# Patient Record
Sex: Female | Born: 1955 | Race: White | Hispanic: No | Marital: Married | Smoking: Former smoker
Health system: Southern US, Community
[De-identification: ages and names within clinical notes are randomized; demographics above are authoritative.]

## PROBLEM LIST (undated history)

## (undated) DIAGNOSIS — E785 Hyperlipidemia, unspecified: Secondary | ICD-10-CM

## (undated) DIAGNOSIS — F419 Anxiety disorder, unspecified: Secondary | ICD-10-CM

## (undated) HISTORY — DX: Hyperlipidemia, unspecified: E78.5

## (undated) HISTORY — PX: TENDON TRANSFER: SHX2488

## (undated) HISTORY — DX: Anxiety disorder, unspecified: F41.9

## (undated) HISTORY — PX: NEUROMA SURGERY: SHX722

---

## 2002-10-10 ENCOUNTER — Emergency Department: Admit: 2002-10-10 | Payer: Self-pay | Source: Emergency Department

## 2006-05-25 ENCOUNTER — Emergency Department: Admit: 2006-05-25 | Payer: Self-pay | Source: Emergency Department | Admitting: Emergency Medical Services

## 2007-08-19 ENCOUNTER — Ambulatory Visit (HOSPITAL_BASED_OUTPATIENT_CLINIC_OR_DEPARTMENT_OTHER)
Admission: RE | Admit: 2007-08-19 | Disposition: A | Discharge status: Home / Self Care | Payer: Self-pay | Source: Ambulatory Visit | Admitting: Obstetrics & Gynecology

## 2010-08-16 ENCOUNTER — Ambulatory Visit: Admit: 2010-08-16 | Discharge: 2010-08-16 | Payer: Self-pay | Source: Ambulatory Visit

## 2010-10-04 DIAGNOSIS — E785 Hyperlipidemia, unspecified: Secondary | ICD-10-CM | POA: Insufficient documentation

## 2010-11-24 NOTE — Op Note (Signed)
Account Number: [id number]      Document ID: [id number]      Admit Date: 08/19/2007      Procedure Date: 08/19/2007            Patient Location: [id number]      Patient Type: A            SURGEON: Vernie Shanks MD      ASSISTANT:                  PREOPERATIVE DIAGNOSIS:      Persistent postmenopausal bleeding.            POSTOPERATIVE DIAGNOSIS:      Persistent postmenopausal bleeding.            TITLE OF PROCEDURE:      Fractional D and C.            ANESTHESIA:      LMAC.            FINDINGS:      Normal amount of tissue.            ESTIMATED BLOOD LOSS:      Minimal.            FLUIDS:      Lactated Ringers.            SPECIMENS:      ECC and EMC.            DESCRIPTION OF PROCEDURE:      The patient was taken to the operating room with an IV in place, placed on      the operating room table in the supine position.  After adequate anesthesia      was administered, she was then placed in the dorsal lithotomy position.      She was prepped and draped in the sterile fashion.  A speculum was placed      into the vagina.  The cervix was identified.  The anterior lip of the      cervix was grasped with an Allis clamp.  The cervix was dilated with Shawnie Pons      dilators.  The cervix was then curetted with a Novak curet.  The      endometrium was then curetted for a small to moderate amount of tissue.      All curettings were sent to pathology.  The Allis clamp was removed from      the cervix.  The cervix was hemostatic.  All instruments were removed from      the vagina.  The patient was awakened and taken to the recovery room in      stable condition after all lap, needle and instrument counts were correct      x2.                        Electronic Signing Provider      _______________________________     Date/Time Signed: _____________      Vernie Shanks MD [phone])            D:  08/19/2007 12:26 PM by Herma Carson. Sheppard Penton, MD [phone])      T:  08/19/2007 13:53 PM by [id number]          Everlean Cherry: [id number]) (Doc ID: [id number])                  cc:

## 2011-01-25 HISTORY — PX: COLONOSCOPY: SHX174

## 2015-05-10 ENCOUNTER — Other Ambulatory Visit: Payer: Self-pay | Admitting: Internal Medicine

## 2015-08-17 ENCOUNTER — Other Ambulatory Visit: Payer: Self-pay | Admitting: Orthopaedic Surgery

## 2015-08-17 DIAGNOSIS — M79645 Pain in left finger(s): Secondary | ICD-10-CM

## 2015-08-18 ENCOUNTER — Other Ambulatory Visit: Payer: Self-pay | Admitting: Orthopaedic Surgery

## 2015-08-18 ENCOUNTER — Ambulatory Visit: Payer: BC Managed Care – PPO | Attending: Orthopaedic Surgery

## 2015-08-18 DIAGNOSIS — M20092 Other deformity of left finger(s): Secondary | ICD-10-CM | POA: Insufficient documentation

## 2015-08-18 DIAGNOSIS — S63659A Sprain of metacarpophalangeal joint of unspecified finger, initial encounter: Secondary | ICD-10-CM

## 2015-08-18 DIAGNOSIS — S62231A Other displaced fracture of base of first metacarpal bone, right hand, initial encounter for closed fracture: Secondary | ICD-10-CM

## 2015-08-18 DIAGNOSIS — M19042 Primary osteoarthritis, left hand: Secondary | ICD-10-CM | POA: Insufficient documentation

## 2015-08-18 DIAGNOSIS — X58XXXA Exposure to other specified factors, initial encounter: Secondary | ICD-10-CM | POA: Insufficient documentation

## 2015-08-18 DIAGNOSIS — S63642A Sprain of metacarpophalangeal joint of left thumb, initial encounter: Secondary | ICD-10-CM | POA: Insufficient documentation

## 2015-08-18 DIAGNOSIS — R600 Localized edema: Secondary | ICD-10-CM | POA: Insufficient documentation

## 2016-09-12 ENCOUNTER — Other Ambulatory Visit (INDEPENDENT_AMBULATORY_CARE_PROVIDER_SITE_OTHER): Payer: Self-pay

## 2016-09-13 LAB — COMPREHENSIVE METABOLIC PANEL
ALT: 45 U/L — ABNORMAL HIGH (ref 6–29)
AST (SGOT): 31 U/L (ref 10–35)
Albumin/Globulin Ratio: 1.6 (calc) (ref 1.0–2.5)
Albumin: 4.7 g/dL (ref 3.6–5.1)
Alkaline Phosphatase: 64 U/L (ref 33–130)
BUN / Creatinine Ratio: 17 (calc) (ref 6–22)
BUN: 18 mg/dL (ref 7–25)
Bilirubin, Total: 0.8 mg/dL (ref 0.2–1.2)
CO2: 29 mmol/L (ref 20–32)
Calcium: 10.1 mg/dL (ref 8.6–10.4)
Chloride: 101 mmol/L (ref 98–110)
Creatinine: 1.04 mg/dL — ABNORMAL HIGH (ref 0.50–0.99)
EGFR African American: 68 mL/min/{1.73_m2} (ref >=60)
Globulin: 3 g/dL (calc) (ref 1.9–3.7)
Glucose: 92 mg/dL (ref 65–99)
NON-AFRICAN AMERICA EGFR: 58 mL/min/{1.73_m2} — ABNORMAL LOW (ref >=60)
Potassium: 4.4 mmol/L (ref 3.5–5.3)
Protein, Total: 7.7 g/dL (ref 6.1–8.1)
Sodium: 140 mmol/L (ref 135–146)

## 2016-09-13 LAB — LIPID PROFILE WRFL LDL DIRECT
Cholesterol / HDL Ratio: 2.9 (calc) (ref <5.0)
Cholesterol: 223 mg/dL — ABNORMAL HIGH (ref <200)
HDL: 77 mg/dL (ref >50)
LDL Calculated: 124 mg/dL (calc) — ABNORMAL HIGH
NON HDL CHOLESTEROL: 146 mg/dL (calc) — ABNORMAL HIGH (ref <130)
Triglycerides: 112 mg/dL (ref <150)

## 2017-06-19 ENCOUNTER — Ambulatory Visit (INDEPENDENT_AMBULATORY_CARE_PROVIDER_SITE_OTHER): Payer: BC Managed Care – PPO | Admitting: Obstetrics & Gynecology

## 2017-06-19 ENCOUNTER — Encounter (INDEPENDENT_AMBULATORY_CARE_PROVIDER_SITE_OTHER): Payer: Self-pay | Admitting: Obstetrics & Gynecology

## 2017-06-19 DIAGNOSIS — Z01411 Encounter for gynecological examination (general) (routine) with abnormal findings: Secondary | ICD-10-CM

## 2017-06-19 DIAGNOSIS — Z1151 Encounter for screening for human papillomavirus (HPV): Secondary | ICD-10-CM

## 2017-06-19 DIAGNOSIS — R350 Frequency of micturition: Secondary | ICD-10-CM

## 2017-06-19 DIAGNOSIS — Z01419 Encounter for gynecological examination (general) (routine) without abnormal findings: Secondary | ICD-10-CM

## 2017-06-19 MED ORDER — NITROFURANTOIN MONOHYD MACRO 100 MG PO CAPS
100.0000 mg | ORAL_CAPSULE | Freq: Two times a day (BID) | ORAL | 0 refills | Status: DC
Start: 2017-06-19 — End: 2018-09-12

## 2017-06-19 NOTE — Progress Notes (Addendum)
Subjective:       Julie Murphy is a 62 y.o. female here for a routine exam.    Current complaints: urine frequency   Going to [facility] tomorrow with her daughter and grandkids    Gynecologic History  No LMP recorded.  Contraception: none  Breast self exam: discussed     Date of last mammogram 05/10/15  Date of last pap smear 03/09/15  Date of last DEXA 05/11/15  Date of last colonoscopy 8 yrs    The following portions of the patient's history were reviewed and updated as appropriate: allergies, current medications, past family history, past medical history, past social history, past surgical history and problem list.      Review of Systems  Pertinent items are noted in HPI.      Objective:      There were no vitals taken for this visit.  PHYSICAL EXAM:   GENERAL:  well developed and nourished; appropriately groomed; in no apparent distress;   EYES:  EOMI; PERRLA; normal lids, conjunctiva,   E/N/T:  normal EACs, TMs, nasal/oral mucosa, teeth, gingiva,  NECK:  supple, full ROM; no thyromegaly;  RESPIRATORY: symmetric expansion; no dyspnea;    GASTROINTESTINAL: nontender, nondistended; no hepatosplenomegaly or masses; rectal exam without masses   GENITOURINARY: external genitalia: normal hair distribution; ; vagina: normal mucosa; ; cervix: no lesions no cervical motion tenderness noted;; Her uterus: is antiverted, small, and nontender; ; adnexa: without masses; ;   LYMPHATICS:  no adenopathy in cervical, supraclavicular, axillary, or inguinal regions;   BREAST/INTEGUMENT: BREASTS: breast exam is normal without masses, skin changes, or nipple discharge;   MUSCULOSKELETAL:  Normal range of motion, strength and tone;   PSYCHIATRIC:  appropriate affect and demeanor; normal speech pattern; grossly normal memory;           Assessment:      Healthy female exam.        Plan:       1. Thin prep pap/HPV Yes   2. Referral for mammogram given  3. To [facility] in one year for annual, or prn for problems  4. Urine culture sent  5. rx  for macrobid sent in    Vernie Shanks, MD   I personally saw and examined this patient on day of exam.  In error this note is signed by MA A. Yevette Edwards, MD

## 2017-06-21 LAB — URINE CULTURE

## 2017-06-25 LAB — THINPREP IMAGING PAP & HPV MRNA E6/E7.
HPV mRNA E6/E7: NOT DETECTED
Pap Interpretation/Result: UNDETERMINED

## 2017-07-02 ENCOUNTER — Other Ambulatory Visit: Payer: Self-pay | Admitting: Obstetrics & Gynecology

## 2017-08-12 ENCOUNTER — Other Ambulatory Visit (INDEPENDENT_AMBULATORY_CARE_PROVIDER_SITE_OTHER): Payer: Self-pay

## 2017-08-13 LAB — COMPREHENSIVE METABOLIC PANEL
ALT: 19 U/L (ref 6–29)
AST (SGOT): 25 U/L (ref 10–35)
Albumin/Globulin Ratio: 1.7 (calc) (ref 1.0–2.5)
Albumin: 4.6 g/dL (ref 3.6–5.1)
Alkaline Phosphatase: 52 U/L (ref 33–130)
BUN: 15 mg/dL (ref 7–25)
Bilirubin, Total: 0.8 mg/dL (ref 0.2–1.2)
CO2: 30 mmol/L (ref 20–32)
Calcium: 9.7 mg/dL (ref 8.6–10.4)
Chloride: 102 mmol/L (ref 98–110)
Creatinine: 0.95 mg/dL (ref 0.50–0.99)
EGFR African American: 75 mL/min/{1.73_m2} (ref >=60)
Globulin: 2.7 g/dL (calc) (ref 1.9–3.7)
Glucose: 103 mg/dL — ABNORMAL HIGH (ref 65–99)
NON-AFRICAN AMERICA EGFR: 65 mL/min/{1.73_m2} (ref >=60)
Potassium: 3.9 mmol/L (ref 3.5–5.3)
Protein, Total: 7.3 g/dL (ref 6.1–8.1)
Sodium: 140 mmol/L (ref 135–146)

## 2017-08-13 LAB — CBC AND DIFFERENTIAL
Baso(Absolute): 49 cells/uL (ref 0–200)
Basophils: 0.7 %
Eosinophils Absolute: 91 cells/uL (ref 15–500)
Eosinophils: 1.3 %
Hematocrit: 42 % (ref 35.0–45.0)
Hemoglobin: 13.8 g/dL (ref 11.7–15.5)
Lymphocytes Absolute: 2604 cells/uL (ref 850–3900)
Lymphocytes: 37.2 %
MCH: 26.6 pg — ABNORMAL LOW (ref 27.0–33.0)
MCHC: 32.9 g/dL (ref 32.0–36.0)
MCV: 81.1 fL (ref 80.0–100.0)
MPV: 10.2 fL (ref 7.5–12.5)
Monocytes Absolute: 560 cells/uL (ref 200–950)
Monocytes: 8 %
Neutrophils Absolute: 3696 cells/uL (ref 1500–7800)
Neutrophils: 52.8 %
Platelets: 210 10*3/uL (ref 140–400)
RBC: 5.18 10*6/uL — ABNORMAL HIGH (ref 3.80–5.10)
RDW: 13.6 % (ref 11.0–15.0)
WBC: 7 10*3/uL (ref 3.8–10.8)

## 2017-08-13 LAB — LIPID PANEL
Cholesterol / HDL Ratio: 3 (calc) (ref <5.0)
Cholesterol: 201 mg/dL — ABNORMAL HIGH (ref <200)
HDL: 67 mg/dL (ref >50)
LDL Calculated: 113 mg/dL (calc) — ABNORMAL HIGH
NON HDL CHOLESTEROL: 134 mg/dL (calc) — ABNORMAL HIGH (ref <130)
Triglycerides: 99 mg/dL (ref <150)

## 2017-08-13 LAB — TSH: TSH: 2.71 mIU/L (ref 0.40–4.50)

## 2017-08-13 LAB — VITAMIN D,25 OH,TOTAL: Vitamin D, 25 OH, Total: 51 ng/mL (ref 30–100)

## 2017-08-26 ENCOUNTER — Other Ambulatory Visit (INDEPENDENT_AMBULATORY_CARE_PROVIDER_SITE_OTHER): Payer: Self-pay | Admitting: Obstetrics & Gynecology

## 2017-08-26 DIAGNOSIS — Z01419 Encounter for gynecological examination (general) (routine) without abnormal findings: Secondary | ICD-10-CM

## 2017-10-12 ENCOUNTER — Other Ambulatory Visit: Payer: Self-pay | Admitting: Orthopaedic Surgery

## 2017-10-28 ENCOUNTER — Encounter (INDEPENDENT_AMBULATORY_CARE_PROVIDER_SITE_OTHER): Payer: Self-pay

## 2017-10-28 ENCOUNTER — Ambulatory Visit (INDEPENDENT_AMBULATORY_CARE_PROVIDER_SITE_OTHER): Payer: BC Managed Care – PPO | Admitting: Neurological Surgery

## 2017-10-28 VITALS — BP 108/71 | HR 72 | Ht 62.0 in | Wt 147.0 lb

## 2017-10-28 DIAGNOSIS — G8929 Other chronic pain: Secondary | ICD-10-CM

## 2017-10-28 DIAGNOSIS — M545 Low back pain: Secondary | ICD-10-CM

## 2017-10-28 NOTE — Progress Notes (Signed)
A comprehensive review of systems was performed and revealed the following:    Constitutional: negative for - fever, chills, sweating, fatigue, change in activity  General: headaches -negative for - recent weight gain, recent weight loss, appetite change  Eyes: negative for - blurry vision, double vision, eye pain, light sensitivity, eye discharge  ENT: negative for - hearing loss, nasal discharge, hoarseness, sore throat, swallowing difficulty  Heart: negative for - chest pain or tightness, palpitations, fainting, other heart trouble  Lungs: negative for - chronic cough, shortness of breath, wheezing, sleep apnea, hemoptysis  Gastrointestinal: negative for - abdominal pain, nausea, vomiting, diarrhea, constipation, bloody stool  Genito-Urinary: negative for - urinary retention, urinary incontinence, urinary urgency, urinary discharge  Musculoskeletal: negative for - joint swelling, joint pain, leg swelling, leg cramping, fractures  Neurological: negative for - dizziness, tremors, memory loss, speech difficulty, seizures  Hematological and Lymphatic: negative for - easy bleeding, easy bruising, swollen nodes  Skin: negative for - rash, enlarged glands, moles or skin lesions, color change, infection  Psychaitric: negative for - anxiety, depression, confusion, excessive stress, suicidal thoughts    All other systems were reviewed by me and are negative.

## 2017-10-29 ENCOUNTER — Other Ambulatory Visit: Payer: Self-pay

## 2017-10-29 ENCOUNTER — Encounter (INDEPENDENT_AMBULATORY_CARE_PROVIDER_SITE_OTHER): Payer: Self-pay | Admitting: Neurological Surgery

## 2017-10-29 ENCOUNTER — Ambulatory Visit: Admission: RE | Admit: 2017-10-29 | Payer: Self-pay | Source: Ambulatory Visit

## 2017-10-29 NOTE — Progress Notes (Signed)
NEUROSURGERY ATTENDING - CONSULTATION NOTE      Date: 10/29/2017  Patient Name: Julie Murphy, Julie Murphy    Please CC and send a report to:  Patient Care Team:  Lars Masson, MD as PCP - General (Family Medicine)    Diagnosis / Chief Complaint:     Low back pain and left buttock/thigh pain    History of Present Illness:     I was asked to see Julie Murphy in consultation in order to render my professional opinion regarding the aforementioned chief complaint.    Julie Murphy is a 62 y.o. right-handed female who presents with low back pain and left buttock to mid thigh pain for the past 15 years which has worsened over the past 3 months.  Patient reports that she has a history of scoliosis which she has known since she was 31.  She has had mild aching back pain for many years but over the past 3 months has noticed that the pain has radiated into the left buttock and into the posterior left thigh midway.  Aggravating factors include heavy lifting and prolonged periods of standing.  Ice and Aleve helped to alleviate.  She denies a specific inciting event.    She has tried medications including anti-inflammatory medications, and oral steroids which have helped calm down her symptoms.  Bed rest and Pilates helps to improve her symptoms.  She denies any previous history of spine surgery.    She rates the pain in her low back a 5/10 usually but today is a 2/10.  She denies any right leg pain.  She rates the pain in her left leg a 5-9/10 usually but today is a 2/10. 10% of her pain is located in the low back versus 90% in the left buttock and left thigh.  Her pain worsens with lifting and bending.  Pain improves with medication.  She denies any weakness.  She reports burning in the same distribution which started approximately 2-3 months ago.  She denies any hand clumsiness.  She denies any difficulty with balance.  She reports difficulty walking longer than 2 miles due to low back pain.  She denies any bowel or bladder  dysfunction.  She denies any difficulty with sexual functioning.  She denies that her sleep is affected by her condition. She states that her symptoms were significantly improved after taking a steroid taper.     History was obtained from chart review and the patient.      Review of Systems:     A comprehensive review of systems was performed and revealed the following:    Constitutional: negative for - fever, chills, sweating, fatigue, change in activity  General: headaches -negative for - recent weight gain, recent weight loss, appetite change   Eyes: negative for - blurry vision, double vision, eye pain, light sensitivity, eye discharge  ENT: negative for - hearing loss, nasal discharge, hoarseness, sore throat, swallowing difficulty  Heart: negative for - chest pain or tightness, palpitations, fainting, other heart trouble  Lungs: negative for - chronic cough, shortness of breath, wheezing, sleep apnea, hemoptysis  Gastrointestinal: negative for - abdominal pain, nausea, vomiting, diarrhea, constipation, bloody stool  Genito-Urinary: negative for - urinary retention, urinary incontinence, urinary urgency, urinary discharge  Musculoskeletal: negative for - joint swelling, joint pain, leg swelling, leg cramping, fractures  Neurological: negative for - dizziness, tremors, memory loss, speech difficulty, seizures  Hematological and Lymphatic: negative for - easy bleeding, easy bruising, swollen nodes  Skin: negative  for - rash, enlarged glands, moles or skin lesions, color change, infection  Psychaitric: negative for - anxiety, depression, confusion, excessive stress, suicidal thoughts    All other systems were reviewed by me and are negative.    Color Code  Yellow - Numbness sensation  Purple - Pins & Needles sensation  Red     - Burning sensation  Blue    - Aching pain  Green Lambert Mody and Stabbing pain  Manson Passey - Other    Past Medical History:     I reviewed the past medical history myself. The patient has a history  of anxiety.     Past Surgical History:     Past Surgical History:   Procedure Laterality Date   . CESAREAN SECTION  12/23/1980   . CESAREAN SECTION  04/18/1983   . NEUROMA SURGERY      lf foot   . TENDON TRANSFER      left thumb x 2     Family History:     Family History   Problem Relation Age of Onset   . Abdominal Aortic Aneurysm Father      Social History:     Social History     Socioeconomic History   . Marital status: Married     Spouse name: Maisie Fus   . Number of children: 2   . Years of education: Not on file   . Highest education level: Not on file   Occupational History   . Not on file   Social Needs   . Financial resource strain: Not on file   . Food insecurity:     Worry: Not on file     Inability: Not on file   . Transportation needs:     Medical: Not on file     Non-medical: Not on file   Tobacco Use   . Smoking status: Former Games developer   . Smokeless tobacco: Never Used   . Tobacco comment: quit 30 yrs ago   Substance and Sexual Activity   . Alcohol use: Yes     Comment: occasionally   . Drug use: No   . Sexual activity: Not on file   Lifestyle   . Physical activity:     Days per week: Not on file     Minutes per session: Not on file   . Stress: Not on file   Relationships   . Social connections:     Talks on phone: Not on file     Gets together: Not on file     Attends religious service: Not on file     Active member of club or organization: Not on file     Attends meetings of clubs or organizations: Not on file     Relationship status: Not on file   . Intimate partner violence:     Fear of current or ex partner: Not on file     Emotionally abused: Not on file     Physically abused: Not on file     Forced sexual activity: Not on file   Other Topics Concern   . Not on file   Social History Narrative   . Not on file     Julie Murphy is married. She has 2 children. She lives with her husband. She works as a Veterinary surgeon. She drinks less than 1 glass of wine a week. She quit smoking 16 years ago. She smoked 1ppd  for 15 years. She denies current tobacco use and  she denies recreational drug use.     Allergies:     No Known Allergies    Medications:     Current Outpatient Medications on File Prior to Visit   Medication Sig Dispense Refill   . citalopram (CELEXA) 40 MG tablet Take 40 mg by mouth daily     . nitrofurantoin, macrocrystal-monohydrate, (MACROBID) 100 MG capsule Take 1 capsule (100 mg total) by mouth 2 (two) times daily 10 capsule 0     No current facility-administered medications on file prior to visit.      Vital Signs:     Vitals:    10/28/17 1551   BP: 108/71   Pulse: 72       Physical Exam:     General: No acute distress, cooperative with examination  Psychologic: Affect appropriate, judgment and insight consistent with situation, no delusions or hallucinations  Skin: Warm, dry, no obvious lesions or scars  Eyes: Sclerae anicteric, no conjunctival injection  ENT: No visible otorrhea, no rhinorrhea, trachea midline  Head: Normocephalic  Neck: No palpable masses  Musculoskeletal: Full ROM, normal muscle tone, no atrophy  Pulmonary: Normal respiratory effort, no audible wheezing  Cardiovascular: No pedal edema, pulses 2+ in bilat lower extremities  Abdominal: Non-tender to palpation, non-distended, no organomegaly, no palpable masses    Neuro exam:   Awake, alert, orientedx3  Speech clear and fluent  Attention span normal  PERRL, EOMI  Facial sensation intact  Face symmetric  Hearing intact  Tongue midline  Shoulder shrug strong bilaterally  Motor:   Arms:     Deltoid  Bicep Tricep Grip IO   Right 5 5 5 5 5    Left  5 5 5 5 5       Legs:     HF KE KF DF PF EHL   Right 5 5 5 5 5 5    Left  5 5 5 5 5 5      No pronator drift  No dysmetria  Light touch and pinprick intact in all 4 extremities   DTRs:     Biceps Triceps Brachiorad Patellar Ankle   Right 2+ 2+ 2+ 2+ 2+   Left  2+ 2+ 2+ 2+ 2+     Positive Hoffmann's sign bilaterally  No Clonus bilat  Gait normal  able to tandem gait  able to toe and heel walk    Right  shoulder hump visible when patient is bent over touching toes.     Labs:     No results found for: WBC, HGB, HCT, MCV, PLT  No results found for: NA, K, CL, CO2  No results found for: INR, PT  No results found for: BUN  No results found for: CREAT    Imaging:     I reviewed the patient's imaging myself. My own interpretation of the MRI of the lumbar spine without contrast is that shows multilevel spondylosis throughout the lumbar spine.  There is a grade 1 spondylolisthesis at L4-L5, with bilateral facet arthropathy at that level.  There is associated severe central stenosis at that level, as well as bilateral foraminal stenosis.  There is mild levoscoliosis with apex at L2.      Assessment:     62 y.o. female presenting with severe low back pain and left-sided buttock pain.  She is nonfocal examination.  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      Plan:     I had an  extensive discussion with the patient regarding the condition. I showed her the imaging, and went over its findings in detail. I explained to the patient that her symptoms are most likely caused by nerve compression at L4-L5 level.  I explained to her that the ultimate treatment is a surgical decompression and posterior spinal fusion.  I told her that at this time my recommendation is to get an epidural steroid injection on the left side at L4-L5, both with and for diagnostic purposes.  I referred to Dr. Lattie Corns from [facility] for the purpose.  I also recommended obtaining x-rays of the lumbar spine and flexion-extension views in order to assess the subluxation at L4-L5.  I will see her back in 2 months to review the results of the injection and x-ray, and to discuss surgical options.  All questions were answered.    Thank you for the opportunity of allowing me to participate in the care of Julie Murphy.      Hoyle Sauer. Mitsuo Budnick, MD  Minimally Invasive and Complex Spine Surgery &  General Neurosurgery  Department of Neurosciences   Medical Group Neurosurgery  Assistant Professor of Neurosurgery  [facility] Address: [address]  [place], [place] [address]  Office Phone [phone]  Appointments [phone]  Fax [phone]

## 2017-11-04 ENCOUNTER — Other Ambulatory Visit: Payer: Self-pay | Admitting: Physician Assistant

## 2017-11-04 ENCOUNTER — Other Ambulatory Visit: Payer: Self-pay | Admitting: Family Medicine

## 2017-11-04 ENCOUNTER — Ambulatory Visit (INDEPENDENT_AMBULATORY_CARE_PROVIDER_SITE_OTHER): Payer: Self-pay | Admitting: Neurological Surgery

## 2017-11-06 ENCOUNTER — Encounter (INDEPENDENT_AMBULATORY_CARE_PROVIDER_SITE_OTHER): Payer: Self-pay | Admitting: Physician Assistant

## 2017-11-13 ENCOUNTER — Encounter (INDEPENDENT_AMBULATORY_CARE_PROVIDER_SITE_OTHER): Payer: Self-pay

## 2017-12-06 HISTORY — PX: COLONOSCOPY: SHX174

## 2017-12-23 ENCOUNTER — Ambulatory Visit (INDEPENDENT_AMBULATORY_CARE_PROVIDER_SITE_OTHER): Payer: BC Managed Care – PPO | Admitting: Neurological Surgery

## 2018-09-01 ENCOUNTER — Telehealth (INDEPENDENT_AMBULATORY_CARE_PROVIDER_SITE_OTHER): Payer: Self-pay | Admitting: Physician Assistant

## 2018-09-01 ENCOUNTER — Encounter (INDEPENDENT_AMBULATORY_CARE_PROVIDER_SITE_OTHER): Payer: Self-pay | Admitting: Physician Assistant

## 2018-09-01 ENCOUNTER — Other Ambulatory Visit (INDEPENDENT_AMBULATORY_CARE_PROVIDER_SITE_OTHER): Payer: Self-pay | Admitting: Physician Assistant

## 2018-09-01 DIAGNOSIS — M48061 Spinal stenosis, lumbar region without neurogenic claudication: Secondary | ICD-10-CM

## 2018-09-01 NOTE — Telephone Encounter (Signed)
Informed patient to get MRI first then f/u with Dr. Irving Burton. Emailed MRI order to patient

## 2018-09-09 ENCOUNTER — Ambulatory Visit: Payer: 59

## 2018-09-11 NOTE — Progress Notes (Signed)
NEUROSURGERY ATTENDING - FOLLOW-UP NOTE      Date: 09/11/2018  Patient Name: Julie Murphy    Please CC and send a report to:  Patient Care Team:  Lars Masson, MD as PCP - General (Family Medicine)  Edgardo Roys, MD    Diagnosis / Chief Complaint:     Low back pain and left buttock/thigh pain    History of Present Illness:     I had the pleasure of seeing Julie Murphy in follow-up today regarding the aforementioned chief complaint.     Julie Murphy is a 63 y.o. right-handed female who initially presented on 10/28/2017 with a 3 month history of worsening low back pain radiating into the left buttock and mid thigh. Patient complained of a 15 year history of symptoms. Aggravating factors include heavy lifting and prolonged periods of standing. Ice and Aleve helped to alleviate. She has tried medications, bedrest and Pilates. 10% of her pain is located in the low back versus 90% in the left buttock and left thigh. 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 She returns today with worsening symptoms.     Today, the patient denies any pain.  She states that 2 weeks ago she was at a Pilates class when she went to get up and experienced acute left low back pain.  The pain subsided after 1 day.  However, 3 days after the initial incident she noticed left thigh numbness.  The numbness is located in the proximal and anterior aspect of the left thigh.  She denies any specific focal motor weakness, bowel or bladder dysfunction.  She states that she no longer has back pain.  She states that before this incident, she experienced significant improvement in her symptoms.  She does Pilates 3-4 times a week with good results.    History was obtained from chart review and the patient.    Review of Systems:     See HPI.           Color Code  Yellow - Numbness sensation  Purple - Pins & Needles sensation  Red     - Burning sensation  Blue    - Aching pain  Green Lambert Mody and Stabbing pain  Manson Passey - Other    Past Medical History:     No past medical history on file.    Past Surgical History:     Past Surgical History:   Procedure Laterality Date    CESAREAN SECTION  12/23/1980    CESAREAN SECTION  04/18/1983    NEUROMA SURGERY      lf foot    TENDON TRANSFER      left thumb x 2       Family History:     Family History   Problem Relation Age of Onset    Abdominal Aortic Aneurysm Father        Social History:     Social History     Socioeconomic History    Marital status: Married     Spouse name: Maisie Fus    Number of children: 2    Years of education: Not on file    Highest education level: Not on file   Occupational History    Not on file   Social Needs    Financial resource strain: Not on file    Food insecurity     Worry: Not on file  Inability: Not on file    Transportation needs     Medical: Not on file     Non-medical: Not on file   Tobacco Use    Smoking status: Former Smoker    Smokeless tobacco: Never Used    Tobacco comment: quit 30 yrs ago   Substance and Sexual Activity    Alcohol use: Yes     Comment: occasionally    Drug use: No    Sexual activity: Not on file   Lifestyle    Physical activity     Days per week: Not on file     Minutes per session: Not on file    Stress: Not on file   Relationships    Social connections     Talks on phone: Not on file     Gets together: Not on file     Attends religious service: Not on file     Active member of club or organization: Not on file     Attends meetings of clubs or organizations: Not on file     Relationship status: Not on file    Intimate partner violence     Fear of current or ex partner: Not on file     Emotionally abused: Not on file     Physically abused: Not on file     Forced sexual activity: Not on file   Other Topics Concern    Not on file   Social History Narrative    Not on file       Allergies:     No Known Allergies    Medications:     Current Outpatient Medications on File Prior to  Visit   Medication Sig Dispense Refill    citalopram (CELEXA) 40 MG tablet Take 40 mg by mouth daily      nitrofurantoin, macrocrystal-monohydrate, (MACROBID) 100 MG capsule Take 1 capsule (100 mg total) by mouth 2 (two) times daily 10 capsule 0     No current facility-administered medications on file prior to visit.        Vital Signs:     There were no vitals filed for this visit.    Physical Exam:     General: No acute distress, cooperative with examination  Psychologic: Affect appropriate, judgment and insight consistent with situation, no delusions or hallucinations  Skin: Warm, dry, no obvious lesions or scars  Eyes: Sclerae anicteric, no conjunctival injection  ENT: No visible otorrhea, no rhinorrhea, trachea midline  Head: Normocephalic  Neck: No palpable masses  Musculoskeletal: Full ROM, normal muscle tone, no atrophy  Pulmonary: Normal respiratory effort, no audible wheezing  Cardiovascular: No pedal edema, pulses 2+ in bilat lower extremities  Abdominal: Non-tender to palpation, non-distended, no organomegaly, no palpable masses    Neuro exam:   Awake, alert, orientedx3  Speech clear and fluent  Attention span normal  PERRL, EOMI  Facial sensation intact  Face symmetric  Hearing intact  Tongue midline  Shoulder shrug strong bilaterally  Motor:   Arms:      Deltoid  Bicep Tricep Grip IO   Right 5 5 5 5 5    Left  5 5 5 5 5        Legs:      HF KE KF DF PF EHL   Right 5 5 5 5 5 5    Left  5 5 5 5 5 5      No pronator drift  No dysmetria  Light touch and pinprick intact in  all 4 extremities except decreased to light touch in the left proximal thigh anteriorly extending from the left lateral hip down to mid left anterior thigh.  DTRs:      Biceps Triceps Brachiorad Patellar Ankle   Right 2+ 2+ 2+ 2+ 2+   Left  2+ 2+ 2+ 2+ 2+     No Hoffmann's sign bilaterally  No Clonus bilaterally  Gait normal  able to tandem gait  able to toe and heel walk    No tenderness to palpation in the bilateral SI joints.  Negative  straight leg raise bilaterally.    Labs:     No results found for: WBC, HGB, HCT, MCV, PLT  No results found for: NA, K, CL, CO2  No results found for: INR, PT  No results found for: BUN  No results found for: CREAT    Imaging:     No new imaging      Assessment:     63 y.o. female initially seen by me approximately 1 year ago for severe leg pain.  Since then the pain had resolved, but has recently reemerged after the patient underwent Pilates training.  In addition, the patient had acute onset left thigh numbness.  On examination, the patient has decreased sensation to light touch in the left anterior thigh.      Plan:     I had an extensive discussion with the patient regarding the condition. I showed her the old imaging, and went over its findings in detail. 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  However, there is no evidence of dynamic instability on flexion-extension, and the patient does not have symptoms of neurogenic claudications.  For this reason, I did not recommend any surgical intervention to fix the problem.  I explained to the patient that her new onset severe low back pain may be musculoskeletal in nature.  For this reason, I recommended physical therapy and conservative treatment.  Regarding her acute onset left thigh numbness, I recommended EMG/NCS of the lower extremities in order to better delineate the etiology of the symptoms.  I also recommended MRI of lumbar spine to rule out any new disc herniation causing nerve root compression, which could be causing her symptoms of numbness.  I will see her again in 1 month to evaluate her condition and go over the results of the studies.  All questions were answered.    I spent over 30 minutes with the patient today and more than 50% of this time was spent in counseling and coordinating the patient's care.    Thank you for the opportunity of allowing me to  participate in the care of Mrs. Julie Murphy.    Hoyle Sauer. Lerone Onder, MD  Minimally Invasive and Complex Spine Surgery & General Neurosurgery  Department of Neurosciences   [facility]  Assistant Professor of Neurosurgery   [facility] Address: [address]   [place], [place] [address]  Office Phone [phone]   Fax [phone]

## 2018-09-12 ENCOUNTER — Ambulatory Visit (INDEPENDENT_AMBULATORY_CARE_PROVIDER_SITE_OTHER): Payer: 59 | Admitting: Neurological Surgery

## 2018-09-12 ENCOUNTER — Encounter (INDEPENDENT_AMBULATORY_CARE_PROVIDER_SITE_OTHER): Payer: Self-pay

## 2018-09-12 ENCOUNTER — Encounter (INDEPENDENT_AMBULATORY_CARE_PROVIDER_SITE_OTHER): Payer: Self-pay | Admitting: Neurological Surgery

## 2018-09-12 VITALS — BP 109/71 | HR 58 | Ht 62.0 in | Wt 148.0 lb

## 2018-09-12 DIAGNOSIS — M48061 Spinal stenosis, lumbar region without neurogenic claudication: Secondary | ICD-10-CM

## 2018-09-15 ENCOUNTER — Encounter (INDEPENDENT_AMBULATORY_CARE_PROVIDER_SITE_OTHER): Payer: Self-pay

## 2018-09-16 ENCOUNTER — Encounter (INDEPENDENT_AMBULATORY_CARE_PROVIDER_SITE_OTHER): Payer: Self-pay | Admitting: Neurological Surgery

## 2018-09-22 ENCOUNTER — Encounter (INDEPENDENT_AMBULATORY_CARE_PROVIDER_SITE_OTHER): Payer: Self-pay | Admitting: Neurological Surgery

## 2018-09-23 ENCOUNTER — Other Ambulatory Visit: Payer: 59

## 2018-09-24 ENCOUNTER — Encounter (INDEPENDENT_AMBULATORY_CARE_PROVIDER_SITE_OTHER): Payer: Self-pay

## 2018-09-24 NOTE — Progress Notes (Signed)
MRI Lumbar spine without contrast   Auth #: [id number]  Dates valid: 09/24/2018-10/23/2018  Location: [facility]

## 2018-09-26 ENCOUNTER — Encounter (INDEPENDENT_AMBULATORY_CARE_PROVIDER_SITE_OTHER): Payer: Self-pay | Admitting: Neurological Surgery

## 2018-09-30 ENCOUNTER — Encounter (INDEPENDENT_AMBULATORY_CARE_PROVIDER_SITE_OTHER): Payer: Self-pay | Admitting: Neurological Surgery

## 2018-10-03 ENCOUNTER — Encounter (INDEPENDENT_AMBULATORY_CARE_PROVIDER_SITE_OTHER): Payer: Self-pay

## 2018-10-03 ENCOUNTER — Ambulatory Visit (INDEPENDENT_AMBULATORY_CARE_PROVIDER_SITE_OTHER): Payer: 59 | Admitting: Neurological Surgery

## 2018-10-03 ENCOUNTER — Encounter (INDEPENDENT_AMBULATORY_CARE_PROVIDER_SITE_OTHER): Payer: Self-pay | Admitting: Neurological Surgery

## 2018-10-03 VITALS — BP 109/69 | HR 66 | Ht 62.0 in | Wt 148.0 lb

## 2018-10-03 DIAGNOSIS — G5712 Meralgia paresthetica, left lower limb: Secondary | ICD-10-CM

## 2018-10-03 DIAGNOSIS — M48061 Spinal stenosis, lumbar region without neurogenic claudication: Secondary | ICD-10-CM

## 2018-10-03 NOTE — Progress Notes (Signed)
NEUROSURGERY ATTENDING - FOLLOW-UP NOTE      Date: 10/03/2018  Patient Name: Julie Murphy, Julie Murphy    Please CC and send a report to:  Patient Care Team:  Lars Masson, MD as PCP - General (Family Medicine)  Edgardo Roys, MD    Diagnosis / Chief Complaint:     Low back pain and left buttock/thigh pain    History of Present Illness:     I had the pleasure of seeing Julie Murphy in follow-up today regarding the aforementioned chief complaint.     Julie Murphy is a 63 y.o. right-handed female who initially presented on 10/28/2017 with a 3 month history of worsening low back pain radiating into the left buttock and mid thigh. Patient complained of a 15 year history of symptoms. Aggravating factors include heavy lifting and prolonged periods of standing. Ice and Aleve helped to alleviate. She has tried medications, bedrest and Pilates. 10% of her pain is located in the low back versus 90% in the left buttock and left thigh. 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 Her last follow up was on 09/12/2018 where she returned with worsening pain. She returns today with EMG/NCS.    Today, the patient rates her pain a 0/10. She reports no interval changes in her symptoms since her last visit. She continues to have left proximal thigh numbness. She denies back pain today but will have tightness in the low back intermittently. Stretching typically helps the tightness. She denies any bowel/bladder dysfunction, saddle anesthesia or difficulty walking long distances. She denies any specific focal motor weakness.     History was obtained from chart review and the patient.    Review of Systems:     See HPI.           Color Code  Yellow - Numbness sensation  Purple - Pins & Needles sensation  Red     - Burning sensation  Blue    - Aching pain  Green Lambert Mody and Stabbing pain  Manson Passey - Other    Past Medical History:     History reviewed. No pertinent past medical history.     Past Surgical History:     Past Surgical History:   Procedure Laterality Date    CESAREAN SECTION  12/23/1980    CESAREAN SECTION  04/18/1983    NEUROMA SURGERY      lf foot    TENDON TRANSFER      left thumb x 2       Family History:     Family History   Problem Relation Age of Onset    Abdominal Aortic Aneurysm Father        Social History:     Social History     Socioeconomic History    Marital status: Married     Spouse name: Maisie Fus    Number of children: 2    Years of education: None    Highest education level: None   Occupational History    None   Social Engineer, site strain: None    Food insecurity     Worry: None     Inability: None    Transportation needs     Medical: None     Non-medical: None   Tobacco Use    Smoking status: Former Smoker    Smokeless tobacco: Never Used    Tobacco comment: quit 30 yrs  ago   Substance and Sexual Activity    Alcohol use: Yes     Comment: occasionally    Drug use: No    Sexual activity: None   Lifestyle    Physical activity     Days per week: None     Minutes per session: None    Stress: None   Relationships    Social connections     Talks on phone: None     Gets together: None     Attends religious service: None     Active member of club or organization: None     Attends meetings of clubs or organizations: None     Relationship status: None    Intimate partner violence     Fear of current or ex partner: None     Emotionally abused: None     Physically abused: None     Forced sexual activity: None   Other Topics Concern    None   Social History Narrative    None       Allergies:     No Known Allergies    Medications:     Current Outpatient Medications on File Prior to Visit   Medication Sig Dispense Refill    atorvastatin (LIPITOR) 10 MG tablet Take 10 mg by mouth daily      citalopram (CELEXA) 40 MG tablet Take 40 mg by mouth daily      vitamin D (CHOLECALCIFEROL) 25 MCG (1000 UT) tablet Take 1,000 Units by mouth daily       No  current facility-administered medications on file prior to visit.        Vital Signs:     Vitals:    10/03/18 1153   BP: 109/69   Pulse: 66       Physical Exam:     General: No acute distress, cooperative with examination  Psychologic: Affect appropriate, judgment and insight consistent with situation, no delusions or hallucinations  Skin: Warm, dry, no obvious lesions or scars  Eyes: Sclerae anicteric, no conjunctival injection  ENT: No visible otorrhea, no rhinorrhea, trachea midline  Head: Normocephalic  Neck: No palpable masses  Musculoskeletal: Full ROM, normal muscle tone, no atrophy  Pulmonary: Normal respiratory effort, no audible wheezing  Cardiovascular: No pedal edema, pulses 2+ in bilat lower extremities  Abdominal: Non-tender to palpation, non-distended, no organomegaly, no palpable masses    Neuro exam:   Awake, alert, orientedx3  Speech clear and fluent  Attention span normal  PERRL, EOMI  Facial sensation intact  Face symmetric  Hearing intact  Tongue midline  Shoulder shrug strong bilaterally  Motor:              Arms:     Deltoid  Bicep Tricep Grip IO   Right 5 5 5 5 5    Left  5 5 5 5 5                  Legs:     HF KE KF DF PF EHL   Right 5 5 5 5 5 5    Left  5 5 5 5 5 5      No pronator drift  No dysmetria  Light touch and pinprick intact in all 4 extremities except decreased to light touch in the left proximal thigh anteriorly extending from the left lateral hip down to mid left anterior thigh.  DTRs:     Biceps Triceps Brachiorad Patellar Ankle   Right 2+ 2+ 2+ 2+  2+   Left  2+ 2+ 2+ 2+ 2+     No Hoffmann's sign bilaterally  No Clonus bilaterally  Gait normal  able to tandem gait  able to toe and heel walk    No tenderness to palpation in the bilateral SI joints.  Negative straight leg raise bilaterally.    Labs:     No results found for: WBC, HGB, HCT, MCV, PLT  No results found for: NA, K, CL, CO2  No results found for: INR, PT  No results found for: BUN  No results found for: CREAT     Imaging:     EMG/NCS 09/26/2018:  Conclusions:  This is a normal electrodiagnostic examination.  There is no evidence of a left lumbosacral radiculopathy or other left lower limb peripheral nerve injury.  Left lateral femoral cutaneous sensory nerve conduction was unobtainable both the left (symptomatic side) and the right (asymptomatic side).  This is not unusual in a patient of this age.  Because of this it is not possible to exclude the possibility of a lateral femoral cutaneous neuropathy contributing to the symptoms.    My own interpretation of the MRI of the lumbar spine without contrast is that shows multilevel spondylosis throughout the lumbar spine.  There is a grade 1 spondylolisthesis at L4-L5, with bilateral facet arthropathy at that level.  There is associated severe central stenosis at that level, as well as bilateral foraminal stenosis.  There is mild levoscoliosis with apex at L2.      Assessment:     63 y.o. female presenting with left thigh numbness with no back pain or radicular leg pain.  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  On examination, the patient has decreased light touch sensation in the left anterior thigh, but is otherwise nonfocal examination.      Plan:     I had an extensive discussion with the patient regarding the condition. I showed her the imaging, and went over its findings in detail. I explained to the patient that it is unlikely that her left thigh pain is coming from her severe lumbar stenosis.  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  I told the patient that the best treatment for this condition is short course of over-the-counter anti-inflammatory medications, as well as weight loss and not wearing any tight clothes.  I told the patient that as long as she is not having significant back pain or any severe pain or weakness in her legs, I would not recommend  serve intervention for correction of her lumbar stenosis.  I also want the patient to let us know immediately if she develops any bowel or bladder dysfunction. I told the patient that there is no need for further scheduled follow up with me, but that I would be more than happy to see her shall any questions or concerns arise. All questions were answered.    I spent over 30 minutes with the patient today and more than 50% of this time was spent in counseling and coordinating the patient's care.    Thank you for the opportunity of allowing me to participate in the care of Julie Murphy.    Hoyle Sauer. Maciah Feeback, MD  Minimally Invasive and Complex Spine Surgery & General Neurosurgery  Department of Neurosciences    [facility] Neurosurgery  Assistant Professor of Neurosurgery   [facility]  Address: [address], [address]   [place], [place] [address]  Office Phone [phone]   Fax [phone]

## 2018-10-17 ENCOUNTER — Ambulatory Visit (INDEPENDENT_AMBULATORY_CARE_PROVIDER_SITE_OTHER): Payer: 59 | Admitting: Neurological Surgery

## 2018-12-23 ENCOUNTER — Other Ambulatory Visit (INDEPENDENT_AMBULATORY_CARE_PROVIDER_SITE_OTHER): Payer: Self-pay | Admitting: Family Medicine

## 2018-12-23 NOTE — Telephone Encounter (Signed)
Spoke with patient aware RX was sent to pharmacy, appointment was scheduled for 01/21/2019

## 2018-12-23 NOTE — Telephone Encounter (Signed)
I have refilled her cholesterol medicine.  Please schedule virtual visit with me

## 2019-01-07 ENCOUNTER — Encounter (INDEPENDENT_AMBULATORY_CARE_PROVIDER_SITE_OTHER): Payer: Self-pay | Admitting: Family Medicine

## 2019-01-08 ENCOUNTER — Encounter (INDEPENDENT_AMBULATORY_CARE_PROVIDER_SITE_OTHER): Payer: Self-pay

## 2019-01-08 DIAGNOSIS — M8580 Other specified disorders of bone density and structure, unspecified site: Secondary | ICD-10-CM

## 2019-01-08 DIAGNOSIS — E559 Vitamin D deficiency, unspecified: Secondary | ICD-10-CM | POA: Insufficient documentation

## 2019-01-08 DIAGNOSIS — F419 Anxiety disorder, unspecified: Secondary | ICD-10-CM | POA: Insufficient documentation

## 2019-01-08 HISTORY — DX: Other specified disorders of bone density and structure, unspecified site: M85.80

## 2019-01-21 ENCOUNTER — Ambulatory Visit (INDEPENDENT_AMBULATORY_CARE_PROVIDER_SITE_OTHER): Payer: 59 | Admitting: Family Medicine

## 2019-01-21 ENCOUNTER — Encounter (INDEPENDENT_AMBULATORY_CARE_PROVIDER_SITE_OTHER): Payer: Self-pay | Admitting: Family Medicine

## 2019-01-21 VITALS — Wt 149.0 lb

## 2019-01-21 DIAGNOSIS — Z1231 Encounter for screening mammogram for malignant neoplasm of breast: Secondary | ICD-10-CM

## 2019-01-21 DIAGNOSIS — F419 Anxiety disorder, unspecified: Secondary | ICD-10-CM

## 2019-01-21 DIAGNOSIS — E782 Mixed hyperlipidemia: Secondary | ICD-10-CM

## 2019-01-21 NOTE — Progress Notes (Signed)
Subjective:      Patient ID: Julie Murphy is a 63 y.o. female.    Chief Complaint:  Chief Complaint   Patient presents with    Hyperlipidemia    Anxiety       HPI:  HPI Patient is seen by a virtual visit in the time of the COVID-19 pandemic.  She is contacted by [email or web address].  Identity was verified.  My credentials were shared.  She is at home in the state of [place].    Patient was seen today for medication check. She has high cholesterol and is taking atorvastatin 40 mg. It has been over a year since she has had her last blood work. She tolerates her medication well without any side effects. No chest pain or shortness of breath. She is exercising about 3-4 times per week.    Patient also has anxiety. She is taking her Celexa 10 mg daily. She feels stable. No active depression or anxiety. She does not notice any side effects.    We discussed the shingles vaccine and I encouraged her to get it when able. Risks and side effects discussed  Problem List:  Patient Active Problem List   Diagnosis    Anxiety    Hyperlipidemia    Osteopenia    Vitamin D deficiency       Current Medications:  Current Outpatient Medications   Medication Sig Dispense Refill    atorvastatin (LIPITOR) 40 MG tablet atorvastatin 40 mg tablet   TAKE 1 TABLET BY MOUTH EVERY DAY      citalopram (CeleXA) 10 MG tablet Take 10 mg by mouth daily      vitamin D (CHOLECALCIFEROL) 25 MCG (1000 UT) tablet Take 2,000 Units by mouth daily         No current facility-administered medications for this visit.        Allergies:  No Known Allergies    Past Medical History:  Past Medical History:   Diagnosis Date    Anxiety     Hyperlipidemia     Osteopenia 01/08/2019       Past Surgical History:  Past Surgical History:   Procedure Laterality Date    CESAREAN SECTION  12/23/1980    CESAREAN SECTION  04/18/1983    COLONOSCOPY  01/25/2011    normal-10 year followup suggested    COLONOSCOPY  12/06/2017    polyp and repeat 3 years    NEUROMA SURGERY Left      lf foot    TENDON TRANSFER Left     left thumb x 2       Family History:  Family History   Problem Relation Age of Onset    Abdominal Aortic Aneurysm Father     Mitral valve prolapse Father     Hyperlipidemia Mother     Colon cancer Neg Hx     Breast cancer Neg Hx     Diabetes Neg Hx     Heart attack Neg Hx     Stroke Neg Hx     Hypertension Neg Hx        Social History:  Social History     Socioeconomic History    Marital status: Married     Spouse name: Maisie Fus    Number of children: 2    Years of education: Not on file    Highest education level: Not on file   Occupational History    Occupation: Location manager strain: Not  on file    Food insecurity     Worry: Not on file     Inability: Not on file    Transportation needs     Medical: Not on file     Non-medical: Not on file   Tobacco Use    Smoking status: Former Smoker     Packs/day: 1.50     Years: 12.00     Pack years: 18.00     Quit date: 79     Years since quitting: 32.9    Smokeless tobacco: Never Used   Substance and Sexual Activity    Alcohol use: Yes     Comment: occasionally    Drug use: No    Sexual activity: Not on file   Lifestyle    Physical activity     Days per week: Not on file     Minutes per session: Not on file    Stress: Not on file   Relationships    Social connections     Talks on phone: Not on file     Gets together: Not on file     Attends religious service: Not on file     Active member of club or organization: Not on file     Attends meetings of clubs or organizations: Not on file     Relationship status: Not on file    Intimate partner violence     Fear of current or ex partner: Not on file     Emotionally abused: Not on file     Physically abused: Not on file     Forced sexual activity: Not on file   Other Topics Concern    Not on file   Social History Narrative    Not on file       The following sections were reviewed this encounter by the provider:   Tobacco   Allergies    Meds   Problems   Med Hx   Surg Hx   Fam Hx          ROS:  Review of Systems per HPI    Vitals:  Wt 67.6 kg (149 lb)    BMI 27.25 kg/m      Objective:     Physical Exam:  Physical Exam no apparent distress     Assessment:     1. Mixed hyperlipidemia  - Comprehensive metabolic panel  - Lipid panel    2. Screening mammogram, encounter for  - Mammo Digital Screening Bilateral W Cad; Future  - Mammo Digital Screening Bilateral W Cad    3. Anxiety      Plan:     1. Mixed hyperlipidemia  Stable on Lipitor 40 mg. Patient will go to [facility] and get a CMP and lipid panel done.  - Comprehensive metabolic panel  - Lipid panel    2. Screening mammogram, encounter for  Patient will complete screening [facility] at [facility]  - Mammo Digital Screening Bilateral W Cad; Future  - Mammo Digital Screening Bilateral W Cad    3. Anxiety  Stable on Celexa 10 mg.    Lars Masson, MD

## 2019-01-27 ENCOUNTER — Other Ambulatory Visit (INDEPENDENT_AMBULATORY_CARE_PROVIDER_SITE_OTHER): Payer: Self-pay | Admitting: Family Medicine

## 2019-01-27 DIAGNOSIS — F419 Anxiety disorder, unspecified: Secondary | ICD-10-CM

## 2019-01-28 ENCOUNTER — Other Ambulatory Visit: Payer: Self-pay | Admitting: Family Medicine

## 2019-02-01 NOTE — Progress Notes (Signed)
Dear Ms. Ranae Palms news. Your mammogram was normal.   Sincerely,    Jason Nest MD

## 2019-02-05 ENCOUNTER — Encounter (INDEPENDENT_AMBULATORY_CARE_PROVIDER_SITE_OTHER): Payer: Self-pay | Admitting: Family Medicine

## 2019-03-21 ENCOUNTER — Other Ambulatory Visit (INDEPENDENT_AMBULATORY_CARE_PROVIDER_SITE_OTHER): Payer: Self-pay | Admitting: Family Medicine

## 2019-03-21 DIAGNOSIS — E782 Mixed hyperlipidemia: Secondary | ICD-10-CM

## 2019-03-22 NOTE — Telephone Encounter (Signed)
I have RF cholesterol med. Please remind pt to complete labs

## 2019-03-23 ENCOUNTER — Encounter (INDEPENDENT_AMBULATORY_CARE_PROVIDER_SITE_OTHER): Payer: Self-pay

## 2019-03-23 NOTE — Telephone Encounter (Signed)
Pt called state she lives in [place] for the winter I will send lab order to her via my chart and she will take it to [facility] to complete

## 2019-03-28 LAB — COMPREHENSIVE METABOLIC PANEL
ALT: 18 IU/L (ref 0–32)
AST (SGOT): 25 IU/L (ref 0–40)
African American eGFR: 72 mL/min/{1.73_m2} (ref >59)
Albumin/Globulin Ratio: 1.9 (ref 1.2–2.2)
Albumin: 4.7 g/dL (ref 3.8–4.8)
Alkaline Phosphatase: 56 IU/L (ref 39–117)
BUN / Creatinine Ratio: 18 (ref 12–28)
BUN: 17 mg/dL (ref 8–27)
Bilirubin, Total: 0.5 mg/dL (ref 0.0–1.2)
CO2: 27 mmol/L (ref 20–29)
Calcium: 9.9 mg/dL (ref 8.7–10.3)
Chloride: 105 mmol/L (ref 96–106)
Creatinine: 0.97 mg/dL (ref 0.57–1.00)
Globulin, Total: 2.5 g/dL (ref 1.5–4.5)
Glucose: 107 mg/dL — ABNORMAL HIGH (ref 65–99)
Potassium: 5.3 mmol/L — ABNORMAL HIGH (ref 3.5–5.2)
Protein, Total: 7.2 g/dL (ref 6.0–8.5)
Sodium: 143 mmol/L (ref 134–144)
non-African American eGFR: 63 mL/min/{1.73_m2} (ref >59)

## 2019-03-28 LAB — LIPID PANEL
Cholesterol / HDL Ratio: 2.7 ratio (ref 0.0–4.4)
Cholesterol: 182 mg/dL (ref 100–199)
HDL: 68 mg/dL (ref >39)
LDL Chol Calculated (NIH): 93 mg/dL (ref 0–99)
Triglycerides: 117 mg/dL (ref 0–149)
VLDL Calculated: 21 mg/dL (ref 5–40)

## 2019-06-22 ENCOUNTER — Other Ambulatory Visit (INDEPENDENT_AMBULATORY_CARE_PROVIDER_SITE_OTHER): Payer: Self-pay | Admitting: Family Medicine

## 2019-06-22 DIAGNOSIS — E782 Mixed hyperlipidemia: Secondary | ICD-10-CM

## 2019-07-02 ENCOUNTER — Encounter (INDEPENDENT_AMBULATORY_CARE_PROVIDER_SITE_OTHER): Payer: Self-pay | Admitting: Family Medicine

## 2019-07-02 DIAGNOSIS — F419 Anxiety disorder, unspecified: Secondary | ICD-10-CM

## 2019-07-02 MED ORDER — CITALOPRAM HYDROBROMIDE 10 MG PO TABS
10.0000 mg | ORAL_TABLET | Freq: Every day | ORAL | 1 refills | Status: DC
Start: 2019-07-02 — End: 2019-08-26

## 2019-08-26 ENCOUNTER — Other Ambulatory Visit (INDEPENDENT_AMBULATORY_CARE_PROVIDER_SITE_OTHER): Payer: Self-pay | Admitting: Family Medicine

## 2019-08-26 DIAGNOSIS — F419 Anxiety disorder, unspecified: Secondary | ICD-10-CM

## 2019-08-26 NOTE — Telephone Encounter (Signed)
Refilled her Celexa.  She is due for fasting office visit with me.  Please schedule the next 2 to 3 months

## 2019-09-02 NOTE — Telephone Encounter (Signed)
Lvm to call back

## 2019-09-02 NOTE — Telephone Encounter (Signed)
Patient moved to [facility] and will request a medical record release once she finds a new physician.   Can we please send RX Refills for any of her medication until she is able to find a doctor to take over.     Thank you!

## 2019-09-22 ENCOUNTER — Other Ambulatory Visit (INDEPENDENT_AMBULATORY_CARE_PROVIDER_SITE_OTHER): Payer: Self-pay | Admitting: Family Medicine

## 2019-09-22 DIAGNOSIS — E782 Mixed hyperlipidemia: Secondary | ICD-10-CM

## 2019-09-22 NOTE — Telephone Encounter (Signed)
Please call patient.  I have refilled your cholesterol medicine.  Please schedule in person visit with me.  Afternoon with light breakfast is fine.

## 2019-09-23 NOTE — Telephone Encounter (Signed)
Left VM

## 2019-11-26 IMAGING — MG MAMMOGRAPHY SCREENING BILATERAL 3D TOMOSYNTHESIS WITH CAD
6 of 9 series · 6 of 25 positions shown · non-contrast
Comparison: Comparison was made to prior exams.

MAMMOGRAPHY SCREENING BILATERAL 3D TOMOSYNTHESIS WITH CAD, 11/26/2019 [DATE]: 
CLINICAL INDICATION: Screening exam.
TECHNIQUE: Digital bilateral mammograms and 3-D Tomosynthesis were obtained. 
These were interpreted both primarily and with the aid of computer-aided 
detection system.

[R XCCL]
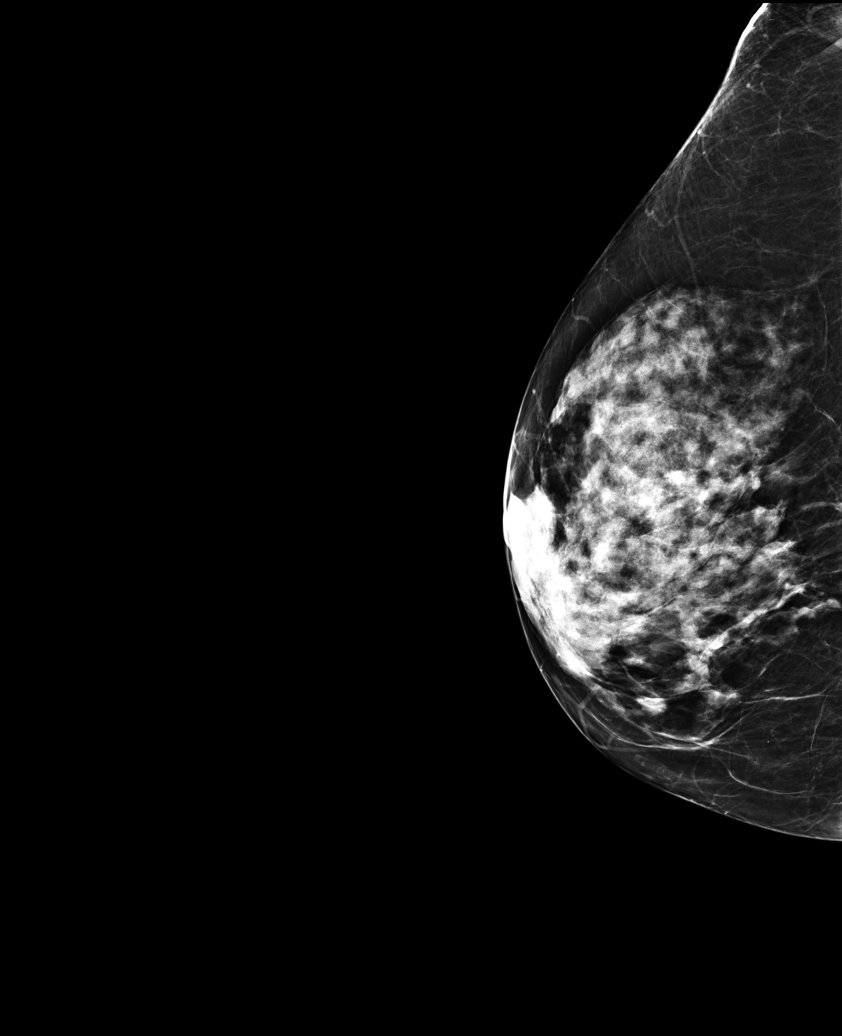

[L CC]
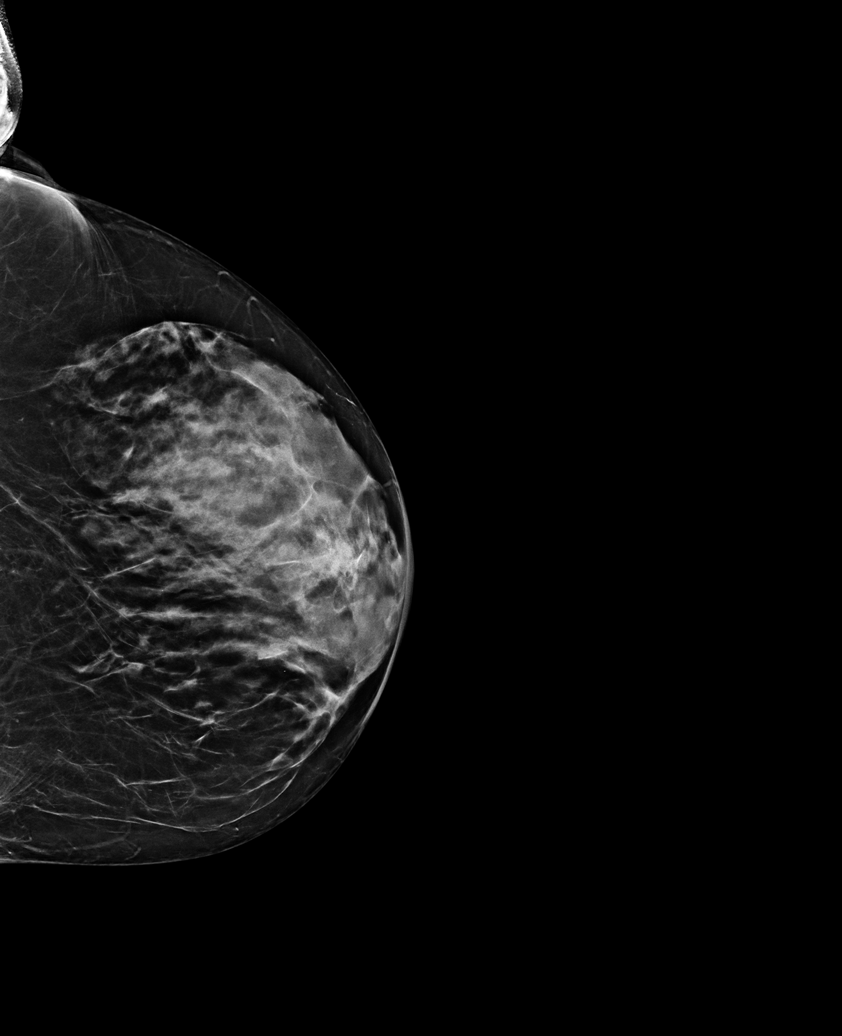

[L MLO]
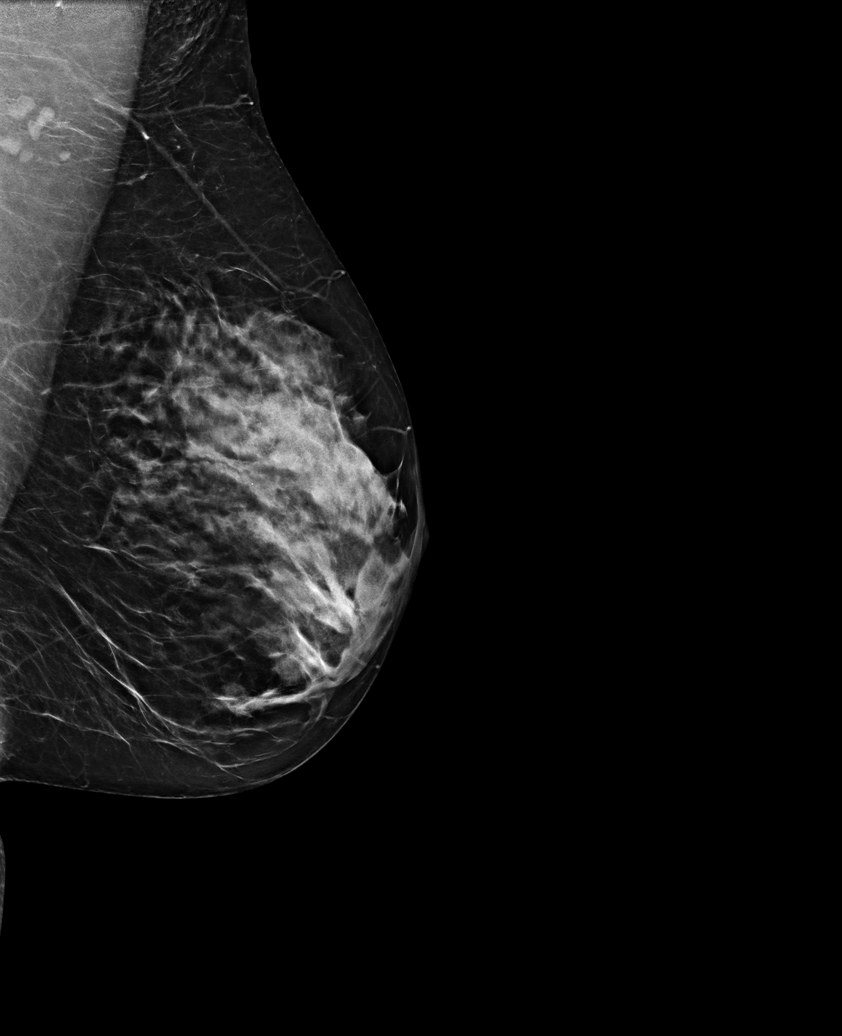

[R CC]
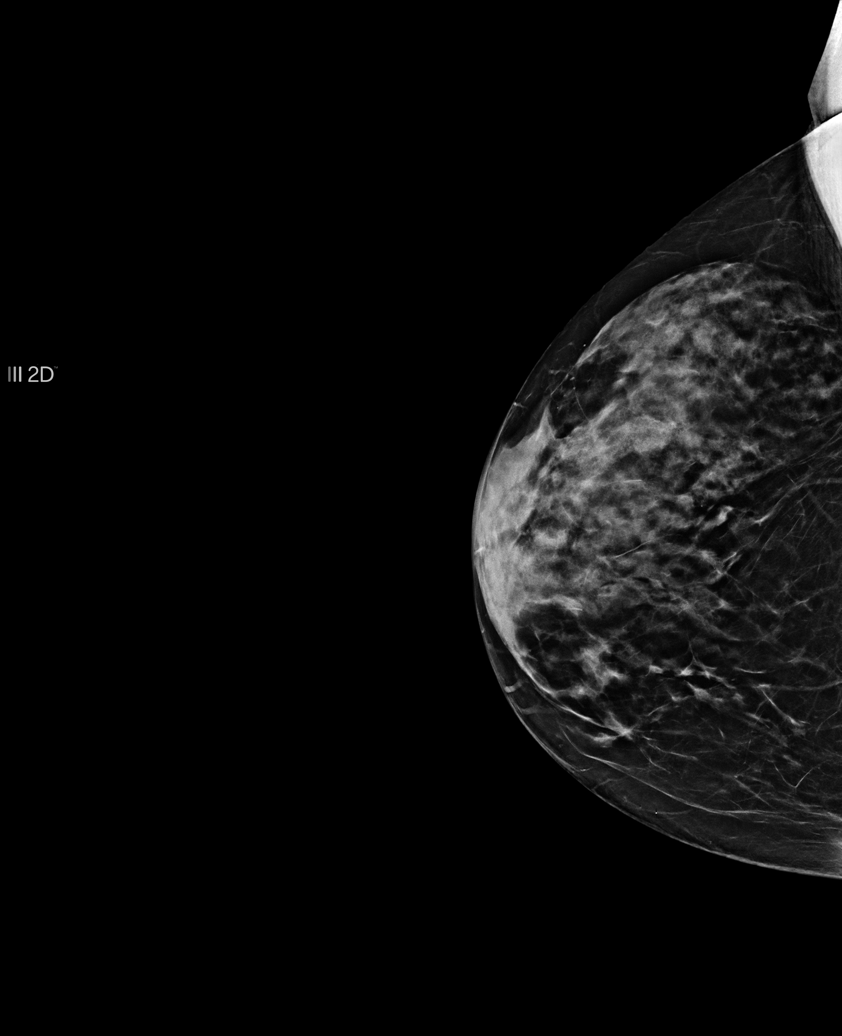

[R MLO]
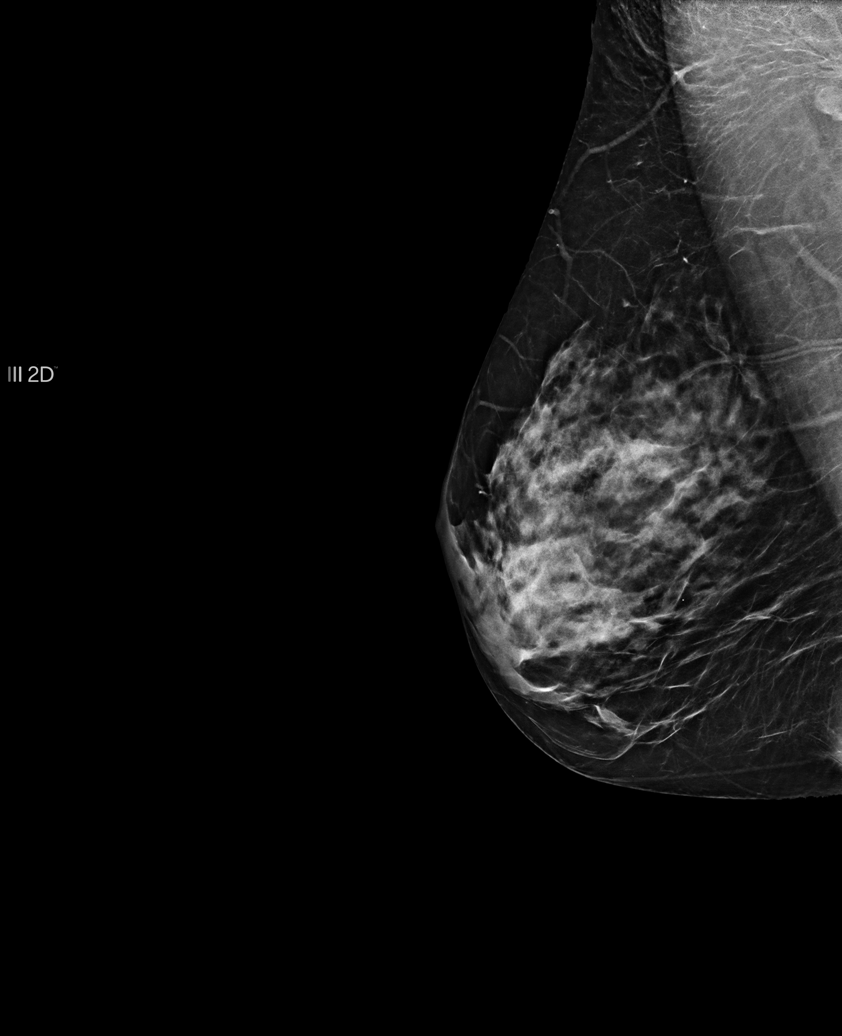

[L MLO tomo · tomo slice 35/69.0]
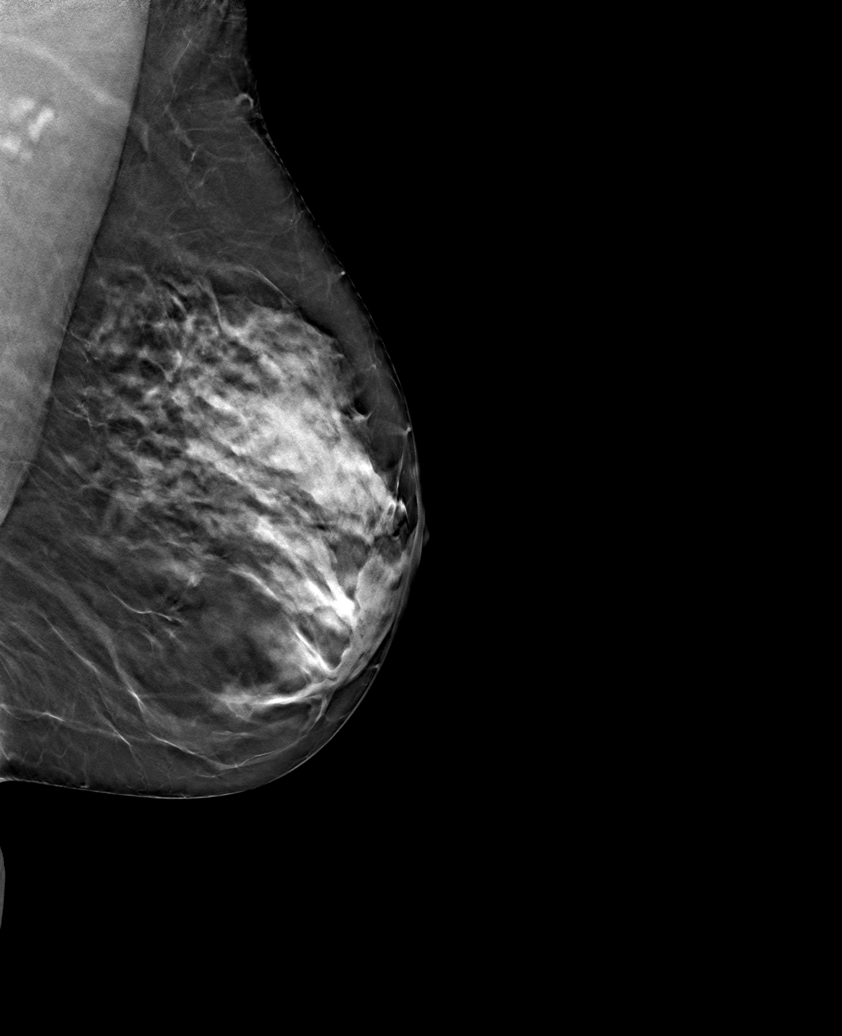

[6 of 25 positions shown; findings below may reference images not displayed]

BREAST DENSITY: (Level C) The breasts are heterogeneously dense, which may 
obscure small masses.
FINDINGS: No suspicious mass, calcifications, or area of architectural 
distortion in either breast.
IMPRESSION: No mammographic evidence of malignancy in either breast. 
( BI-RADS 1) Negative mammogram. Routine mammographic follow-up is recommended.

## 2019-12-11 ENCOUNTER — Encounter (INDEPENDENT_AMBULATORY_CARE_PROVIDER_SITE_OTHER): Payer: Self-pay | Admitting: Family Medicine

## 2019-12-15 ENCOUNTER — Encounter (INDEPENDENT_AMBULATORY_CARE_PROVIDER_SITE_OTHER): Payer: Self-pay | Admitting: Family Medicine

## 2020-02-16 IMAGING — MR MRI ABDOMEN W/WO CONTRAST
18 of 19 series · 43 of 48 positions shown · IV contrast (gadolinium)
Comparison: Ultrasound examination of the abdomen of 11/27/2019.

MRI ABDOMEN W/WO CONTRAST, 02/16/2020 [DATE]: 
CLINICAL INDICATION: Abnormal AFP. Follow up ultrasound. 2 liver cysts. Check 
size.
TECHNIQUE: Multiplanar, multiecho position MR images of the abdomen were 
performed without and with intravenous gadolinium enhancement.  7 mL of Gadavist 
were administered per protocol. The patient's eGFR was calculated to be 56 using 
the i-STAT device.

[Series 101: survey-head 1st · axial · 15.0mm · 1.76mm/px · 1 of 15 slices shown]
[im 1/15]
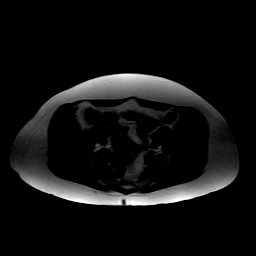

[Series 301: T2 · coronal · 5.0mm · 0.83mm/px · 1 of 32 slices shown]
[im 1/32]
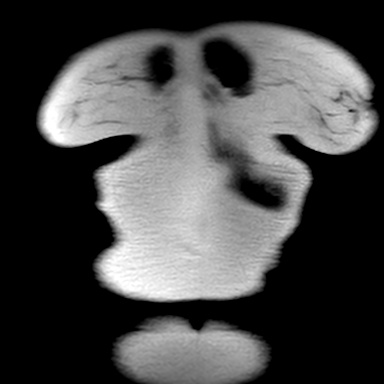

[Series 402: sout of phase · axial · 6.0mm · 1.11mm/px · 1 of 32 slices shown]
[im 1/32]
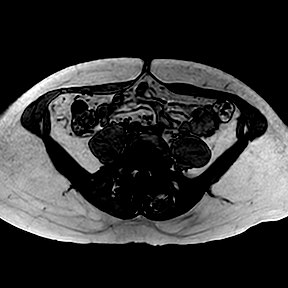

[Series 403: sin phase · axial · 6.0mm · 1.11mm/px · 1 of 32 slices shown]
[im 1/32]
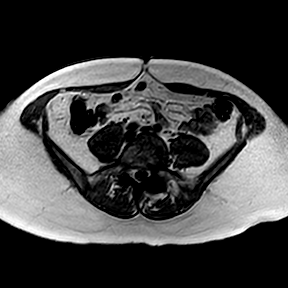

[Series 501: t2_ax_mvxd_hr_rt · axial · 5.0mm · 0.91mm/px · 1 of 40 slices shown]
[im 1/40]
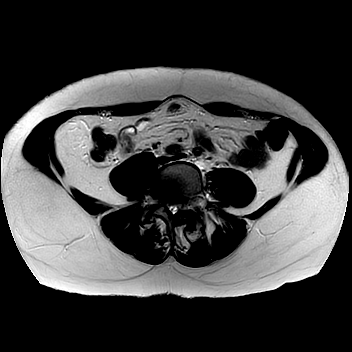

[Series 601: t2_spair mvxd_rt_fast · axial · 5.0mm · 0.83mm/px · 1 of 40 slices shown]
[im 1/40]
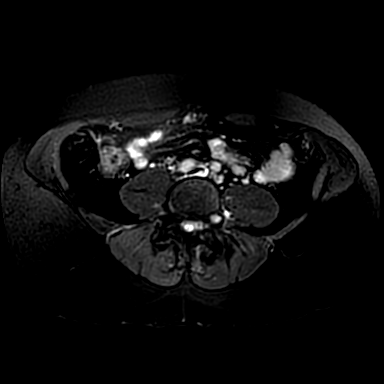

[Series 701: dwi_3b_rt · axial · 5.0mm · 1.56mm/px · z∈[+5,+241]mm · 3 of 88 slices shown]
[im 1/88]
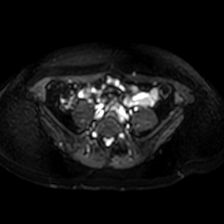
[im 44/88]
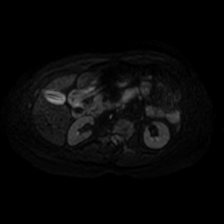
[im 88/88]
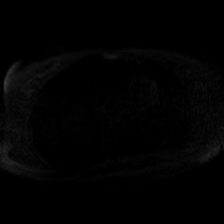

[Series 702: sbo · axial · 5.0mm · 1.56mm/px · 1 of 44 slices shown]
[im 1/44]
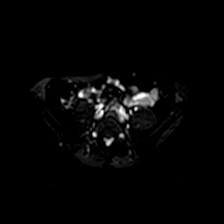

[Series 703: (id) · axial · 5.0mm · 1.56mm/px · 1 of 44 slices shown]
[im 1/44]
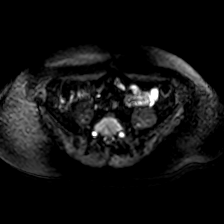

[Series 704: dadc 600 · axial · 5.0mm · 1.56mm/px · 1 of 44 slices shown]
[im 1/44]
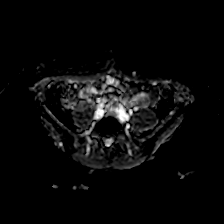

[Series 803: DIXON · axial · 4.0mm · 0.91mm/px · z∈[-2,+236]mm · 4 of 120 slices shown (1 of 4)]
[im 1/120]
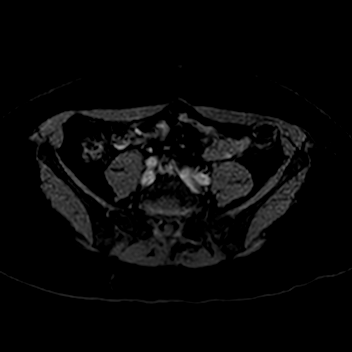
[im 40/120]
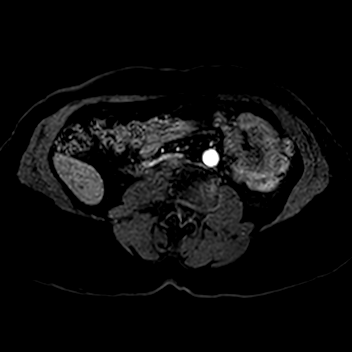
[im 80/120]
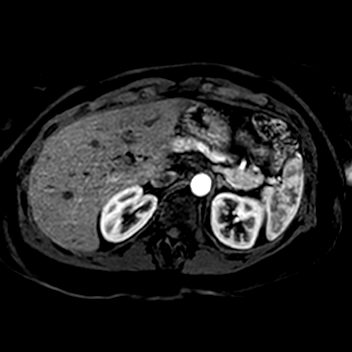
[im 120/120]
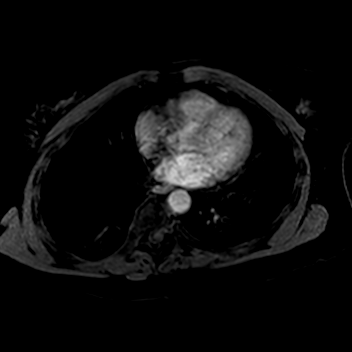

[Series 804: DIXON · axial · 4.0mm · 0.91mm/px · z∈[-2,+236]mm · 4 of 120 slices shown (2 of 4)]
[im 1/120]
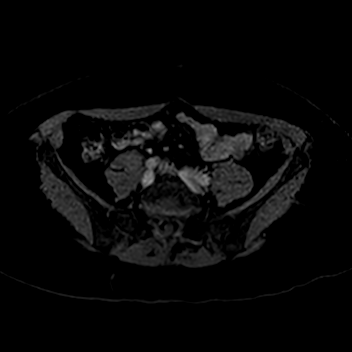
[im 40/120]
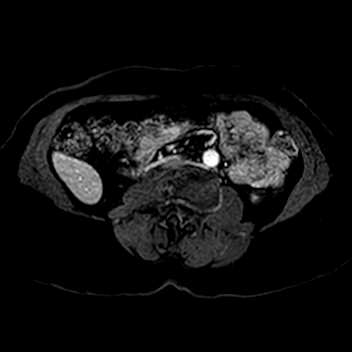
[im 80/120]
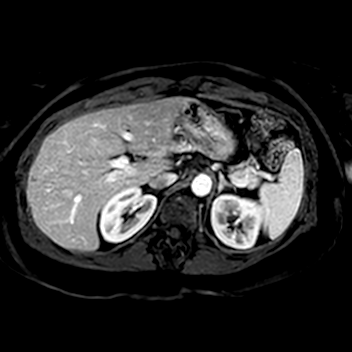
[im 120/120]
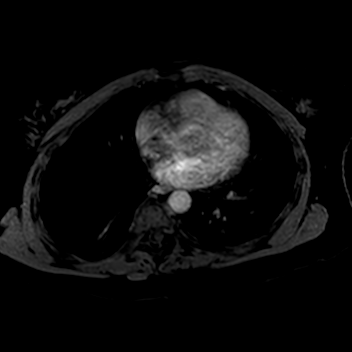

[Series 805: DIXON · axial · 4.0mm · 0.91mm/px · z∈[-2,+236]mm · 4 of 120 slices shown (3 of 4)]
[im 1/120]
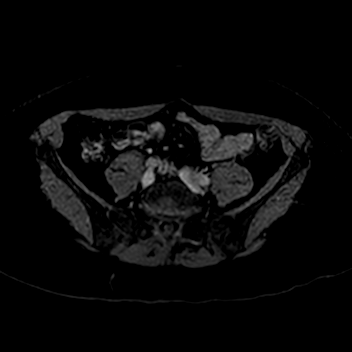
[im 40/120]
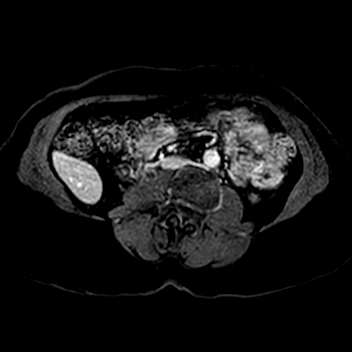
[im 80/120]
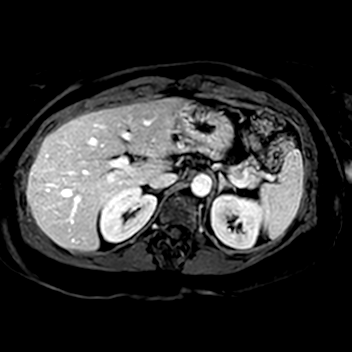
[im 120/120]
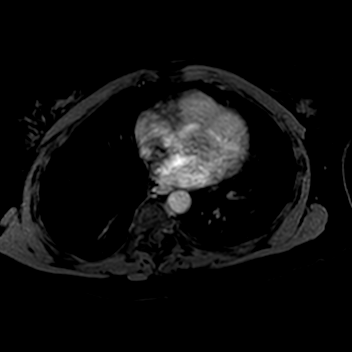

[Series 806: DIXON · axial · 4.0mm · 0.91mm/px · z∈[-2,+236]mm · 4 of 120 slices shown (4 of 4)]
[im 1/120]
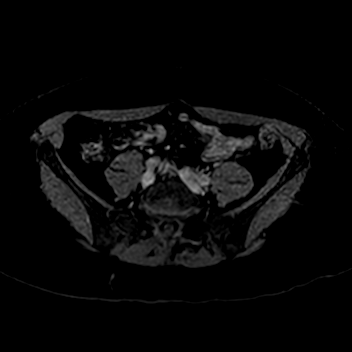
[im 40/120]
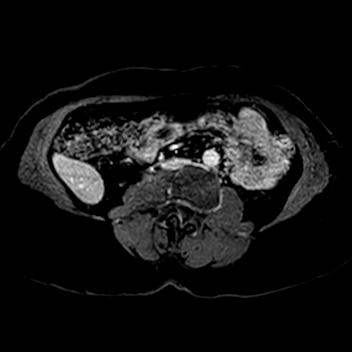
[im 80/120]
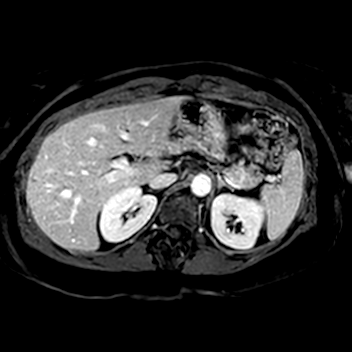
[im 120/120]
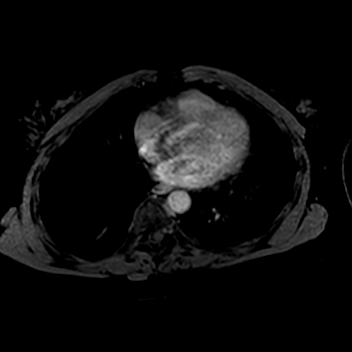

[Series 807: DIXON post-contrast · axial · 4.0mm · 0.91mm/px · z∈[-2,+236]mm · 4 of 120 slices shown (1 of 4)]
[im 1/120]
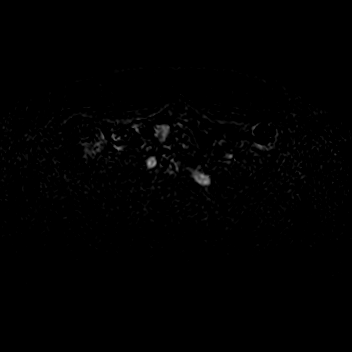
[im 40/120]
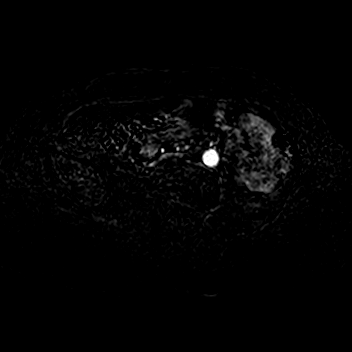
[im 80/120]
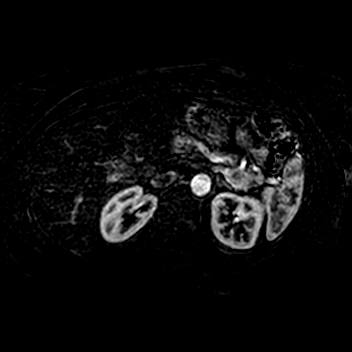
[im 120/120]
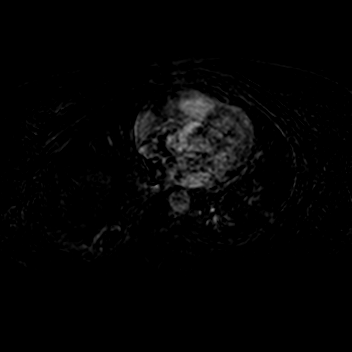

[Series 808: DIXON post-contrast · axial · 4.0mm · 0.91mm/px · z∈[-2,+236]mm · 4 of 120 slices shown (2 of 4)]
[im 1/120]
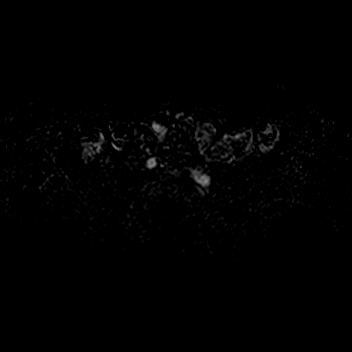
[im 40/120]
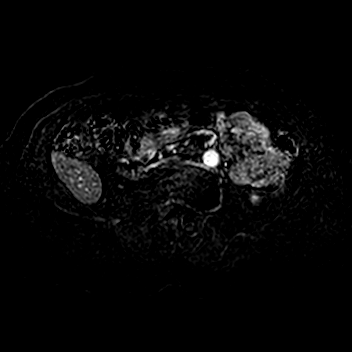
[im 80/120]
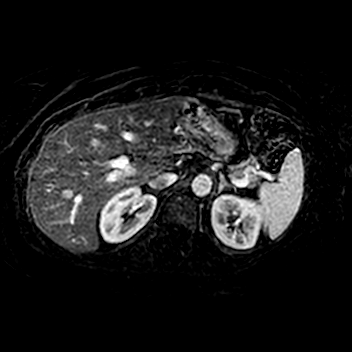
[im 120/120]
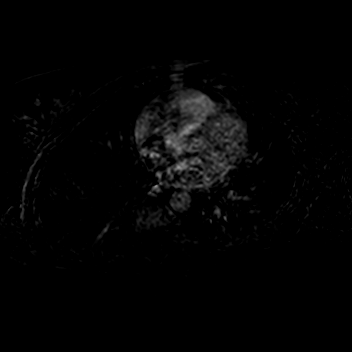

[Series 809: DIXON post-contrast · axial · 4.0mm · 0.91mm/px · z∈[-2,+236]mm · 4 of 120 slices shown (3 of 4)]
[im 1/120]
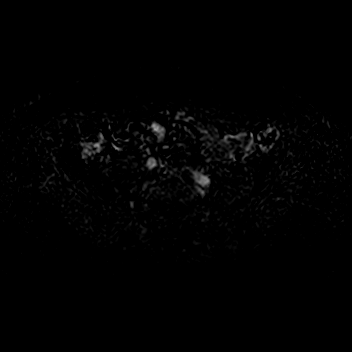
[im 40/120]
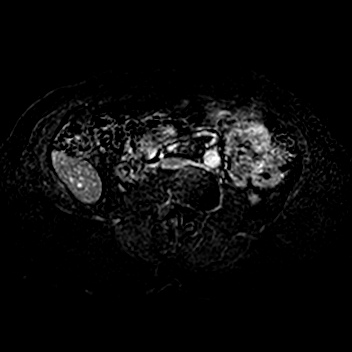
[im 80/120]
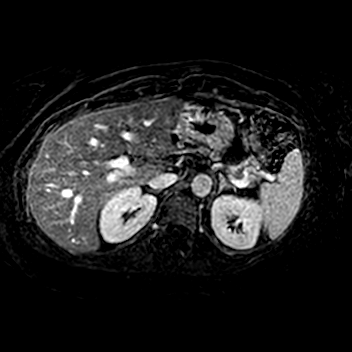
[im 120/120]
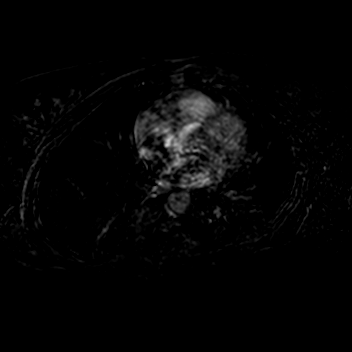

[Series 810: DIXON post-contrast · axial · 4.0mm · 0.91mm/px · z∈[-2,+156]mm · 3 of 120 slices shown (4 of 4)]
[im 1/120]
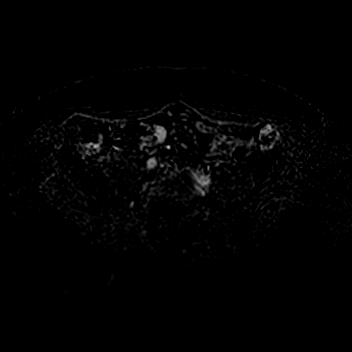
[im 40/120]
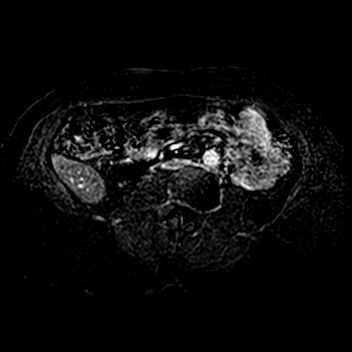
[im 80/120]
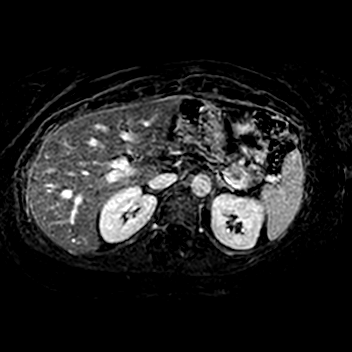

[43 of 48 positions shown; findings below may reference images not displayed]

FINDINGS: The liver and spleen are normal in size. No signal dropout of the liver on the 
out of phase series to suggest hepatic steatosis. There are hepatic cysts with a 
8 mm cyst within the periphery of the right hepatic lobe. There is a 4 to 5 mm 
cyst within the left hepatic lobe. Otherwise, no abnormal area of restricted 
diffusion within the liver or area of abnormal enhancement. 
6 mm cyst right superior renal cortex. No enhancing renal mass. The pancreas, 
adrenal glands, spleen, gallbladder and included bowel loops are negative. Aorta 
and cava are negative. No lymphadenopathy. Scoliosis. No focal marrow replacing 
lesion.
IMPRESSION: 1.  Subcentimeter hepatic cysts, without significant interval change given the 
intrinsic differences between the ultrasound and MR exam. 
2.  Simple right renal cyst.

## 2020-07-15 ENCOUNTER — Telehealth (INDEPENDENT_AMBULATORY_CARE_PROVIDER_SITE_OTHER): Payer: Self-pay | Admitting: Family Medicine

## 2020-07-15 NOTE — Telephone Encounter (Signed)
Spoke with pt for reminder mammogram call. Pt states that she moved to [place]/ new pcp.

## 2021-02-20 IMAGING — MG MAMMOGRAPHY SCREENING BILATERAL 3[PERSON_NAME]
8 series · 8 of 24 positions shown · non-contrast
Comparison: Comparison was made to prior examinations.

________________________________________________________________________________________________ 
MAMMOGRAPHY SCREENING BILATERAL 3INGOLFUR RAMPAI, 02/20/2021 [DATE]: 
CLINICAL INDICATION: Screening.
TECHNIQUE: Digital bilateral mammograms and 3-D Tomosynthesis were obtained. 
These were interpreted both primarily and with the aid of computer-aided 
detection system.  
BREAST DENSITY: (Level C) The breasts are heterogeneously dense, which may 
obscure small masses.

[R MLO]
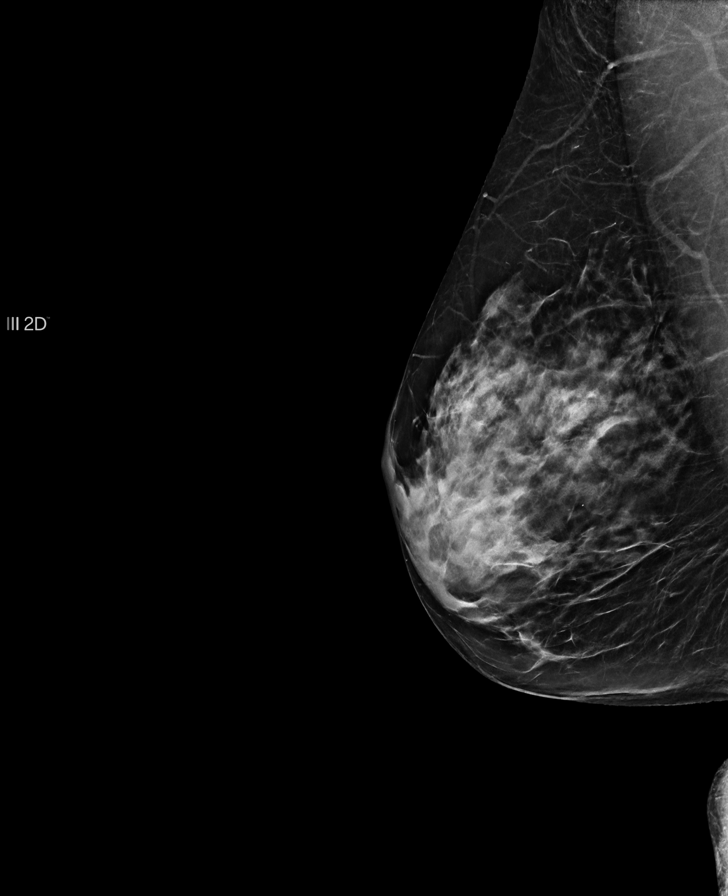

[L CC]
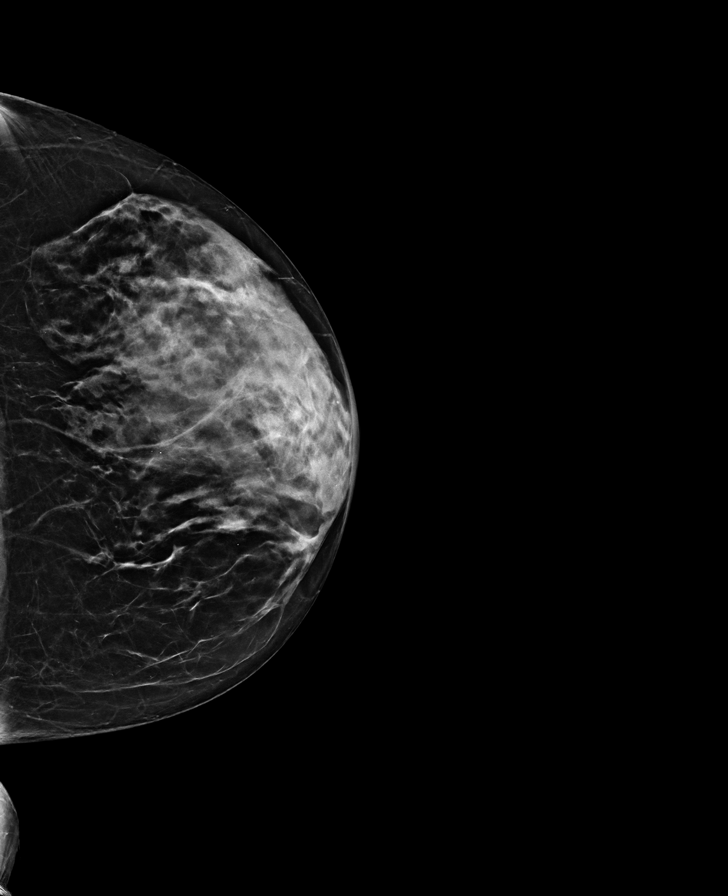

[L MLO]
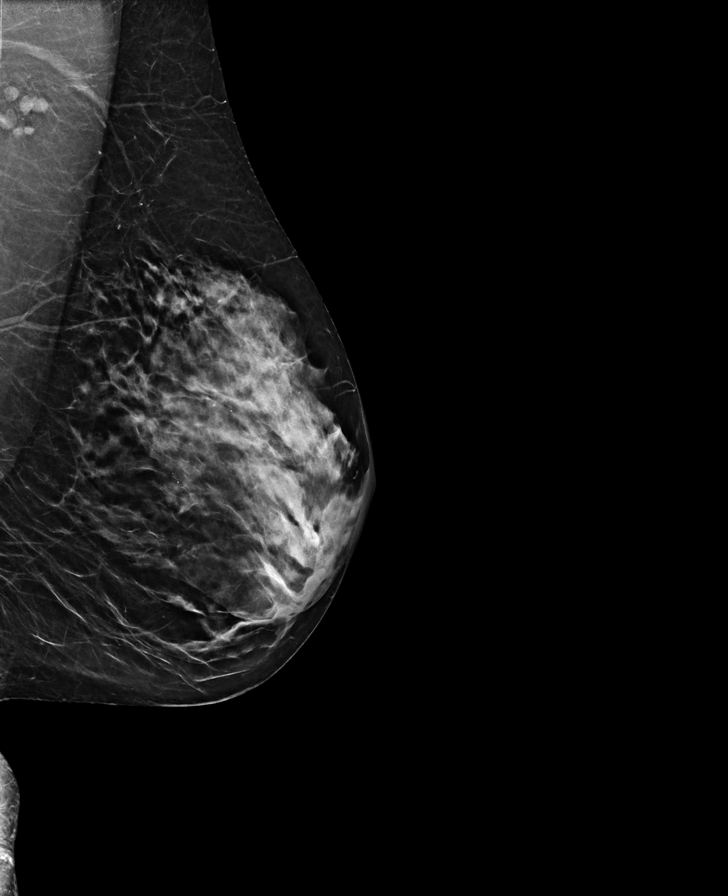

[R CC]
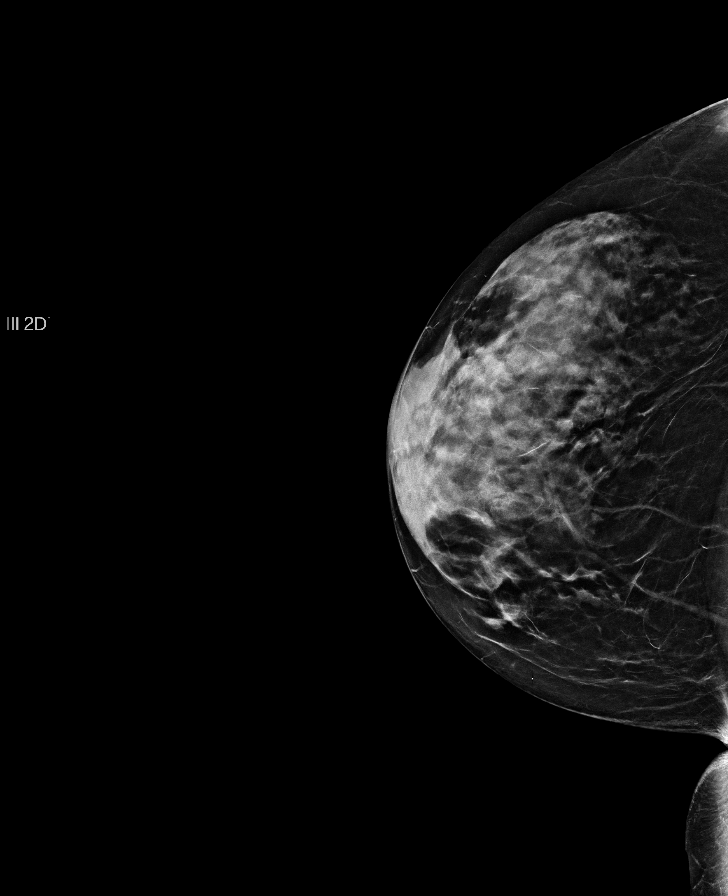

[L MLO tomo · tomo slice 30/59.0]
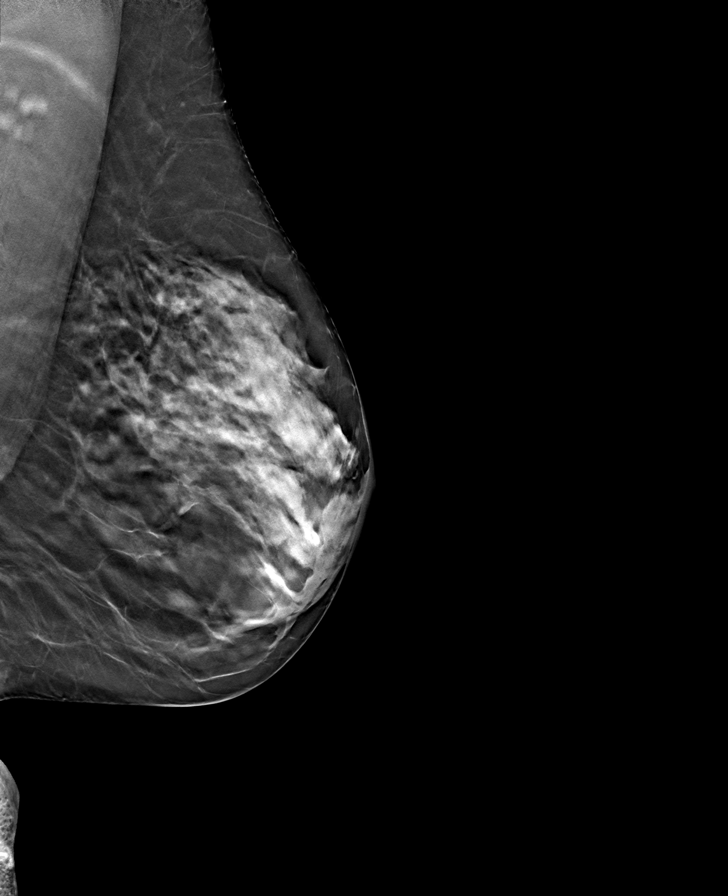

[R MLO tomo · tomo slice 29/58.0]
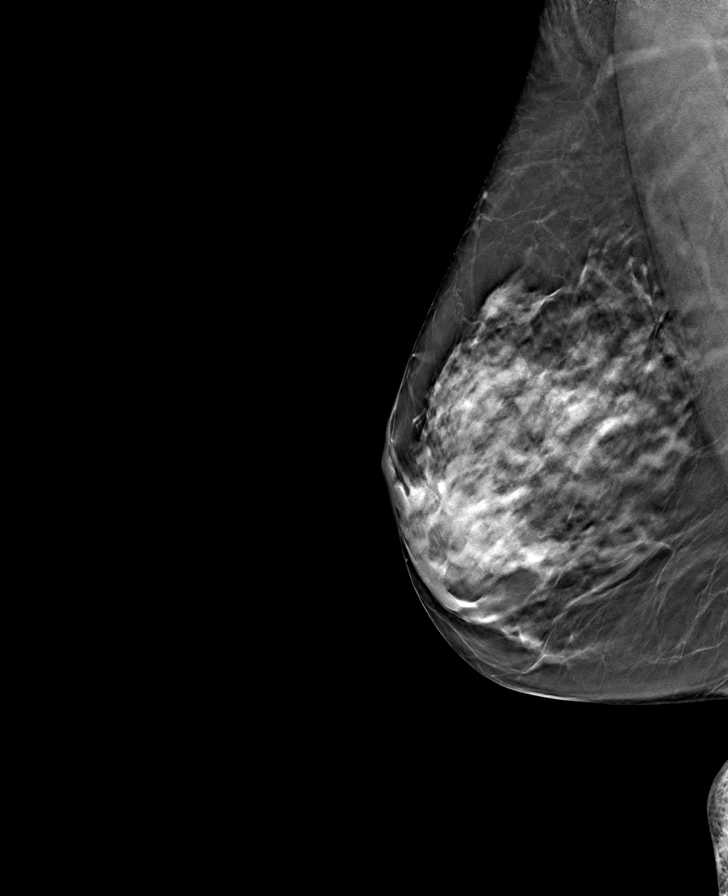

[L CC tomo · tomo slice 29/57.0]
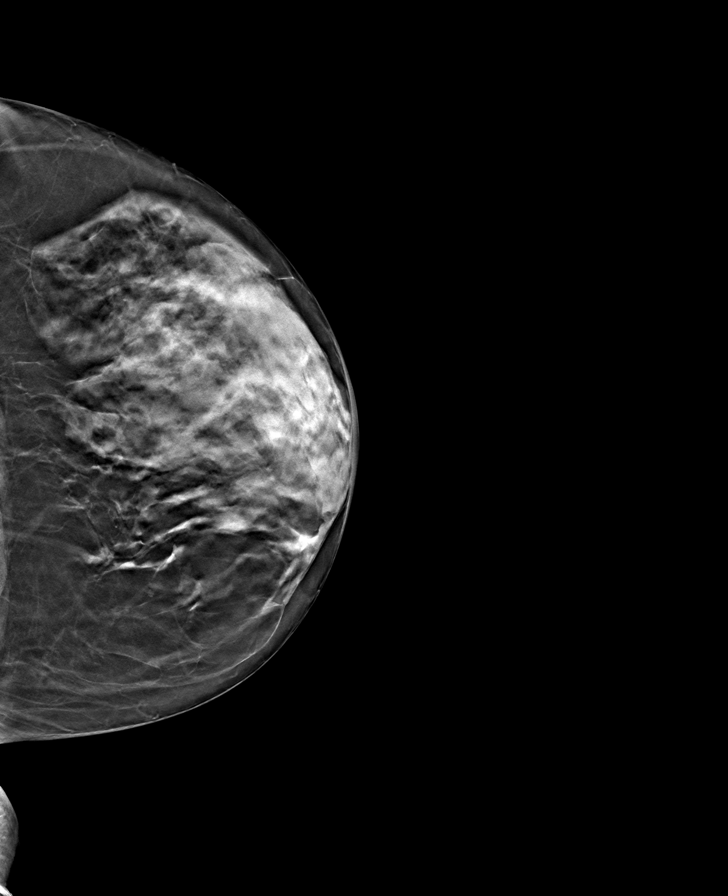

[R CC tomo · tomo slice 29/56.0]
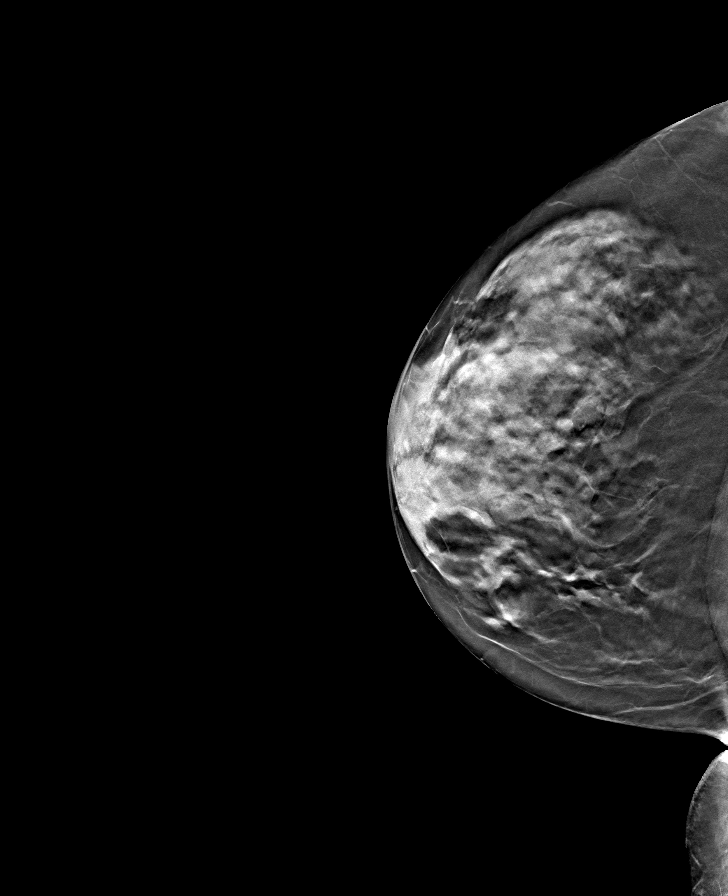

[8 of 24 positions shown; findings below may reference images not displayed]

FINDINGS: Benign calcification. No suspicious mass, calcifications, or area of 
architectural distortion in either breast.
IMPRESSION: Stable mammogram.  
(BI-RADS 2) Benign findings. Routine mammographic follow-up is recommended.

## 2021-07-07 IMAGING — DX LUMBAR SPINE 2 VIEW
2 series · 2 of 2 positions shown · non-contrast
Comparison: None

________________________________________________________________________________________________ 
LUMBAR SPINE 2 VIEW, 07/07/2021 [DATE]: 
CLINICAL INDICATION:  Right groin pain, low back pain

[lateral (1 of 2)]
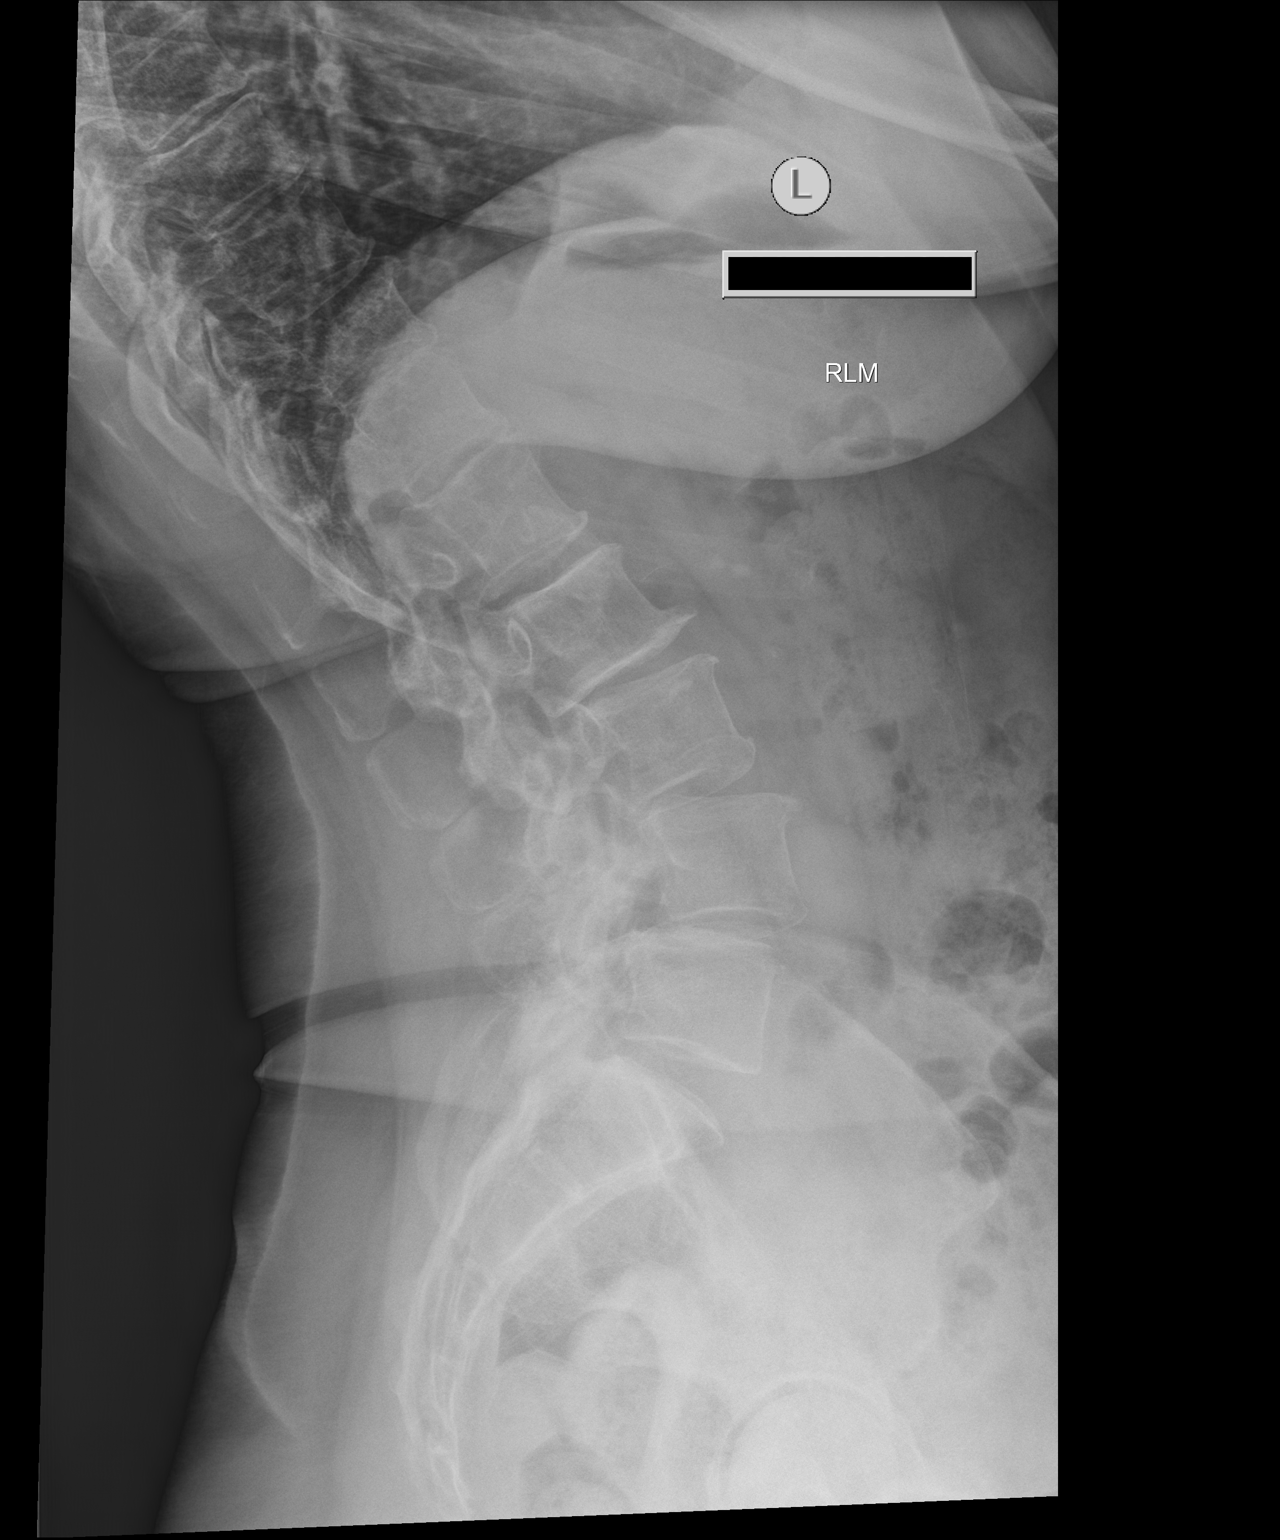

[lateral (2 of 2)]
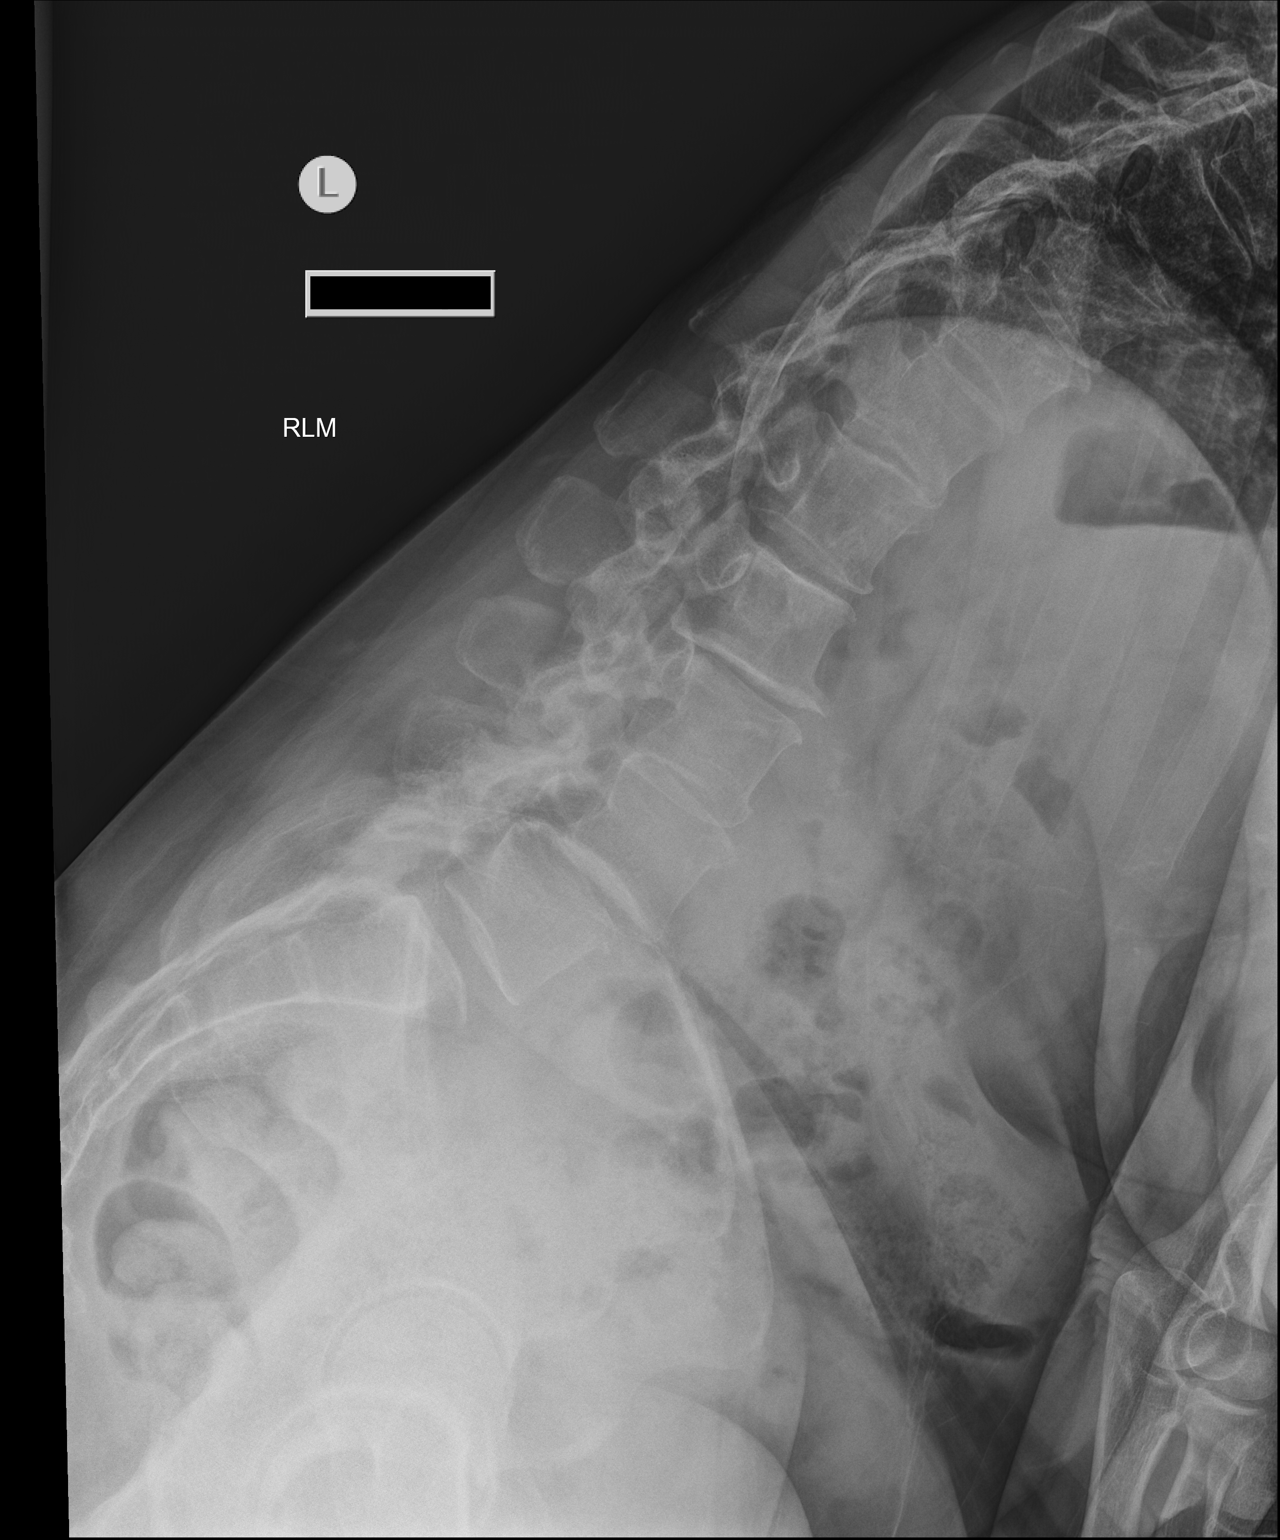

[2 of 2 positions shown; findings below may reference images not displayed]

FINDINGS: Flexion and extension views only show grade 1 anterolisthesis at L4-5, 
not different between flexion and extension. There is less than grade 1 
retrolisthesis at L2-3, slightly increased in extension. There is marked disc 
narrowing at L4-5, moderate to marked narrowing at L2-3 and L3-4. 
No evidence for compression fracture or bone destruction. Facet changes are most 
pronounced at the lower 3 levels. No AP view obtained.
IMPRESSION: Anterolisthesis at L4-5 does not show dynamic subluxation. Slight increase in 
retrolisthesis at L2-3 and extension.

## 2021-07-07 IMAGING — DX SACROILIAC JOINT 2 VIEW ONLY
3 series · 3 of 3 positions shown · non-contrast
Comparison: None.

________________________________________________________________________________________________ 
SACROILIAC JOINT 2 VIEW ONLY, 07/07/2021 [DATE]: 
CLINICAL INDICATION: Right groin pain, low back pain.

[AP]
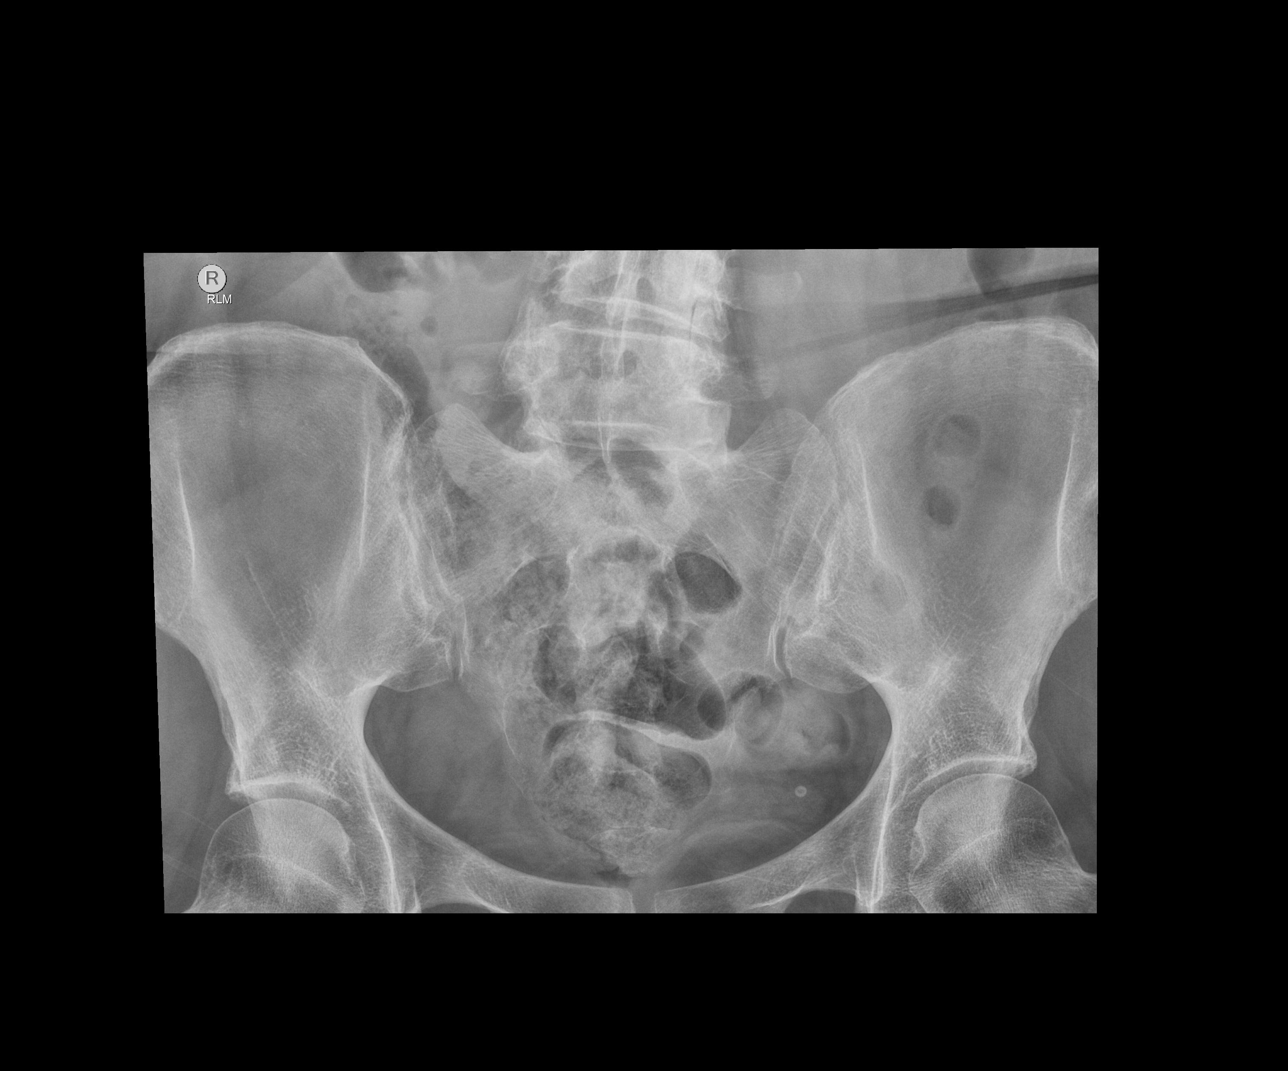

[oblique (1 of 2)]
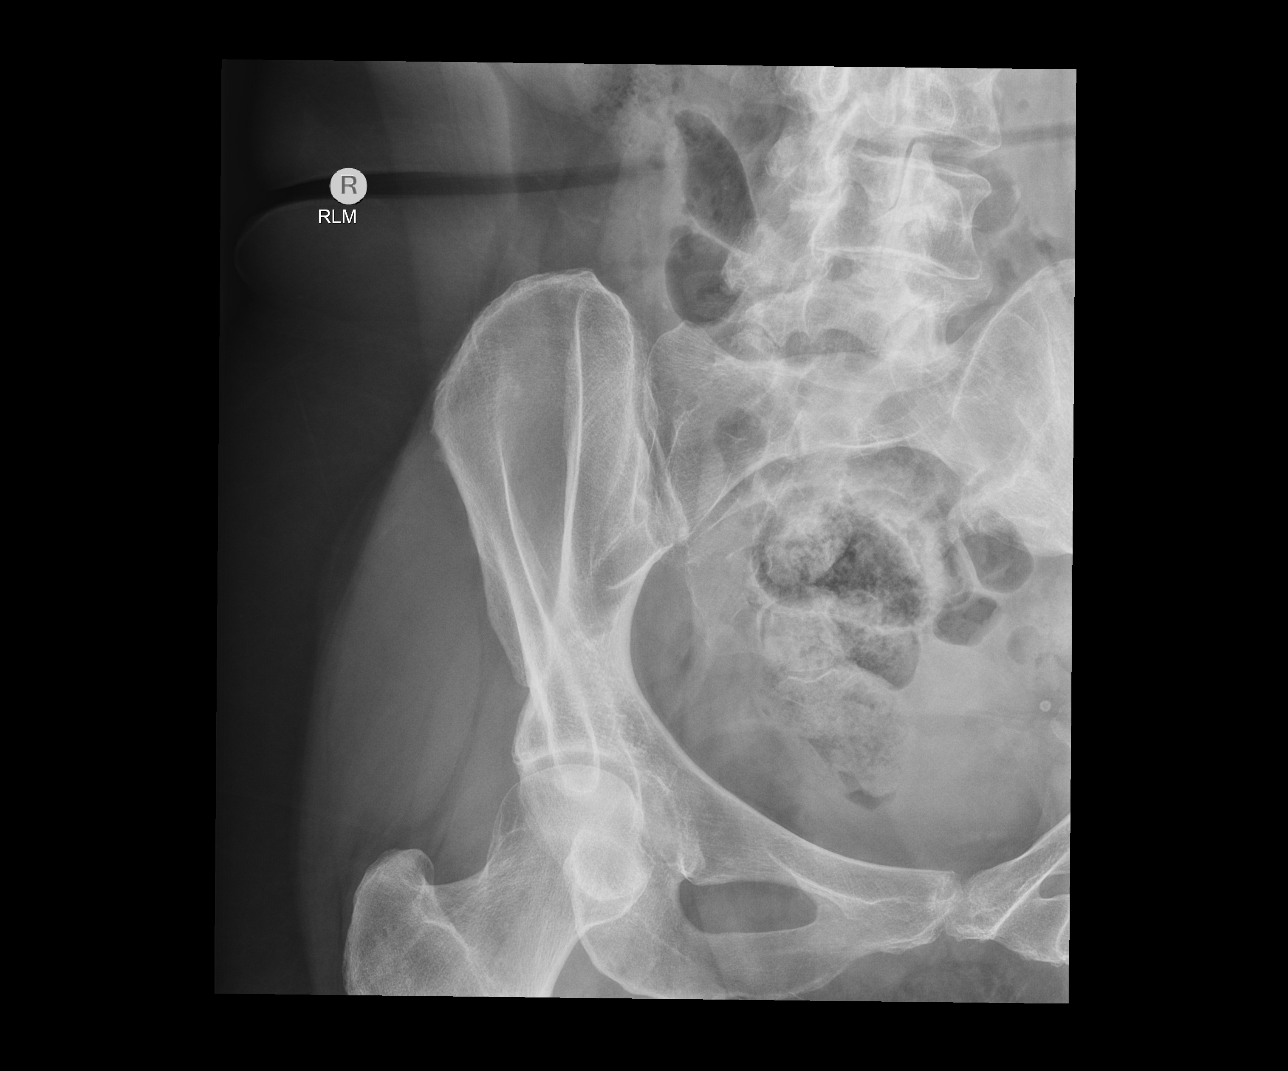

[oblique (2 of 2)]
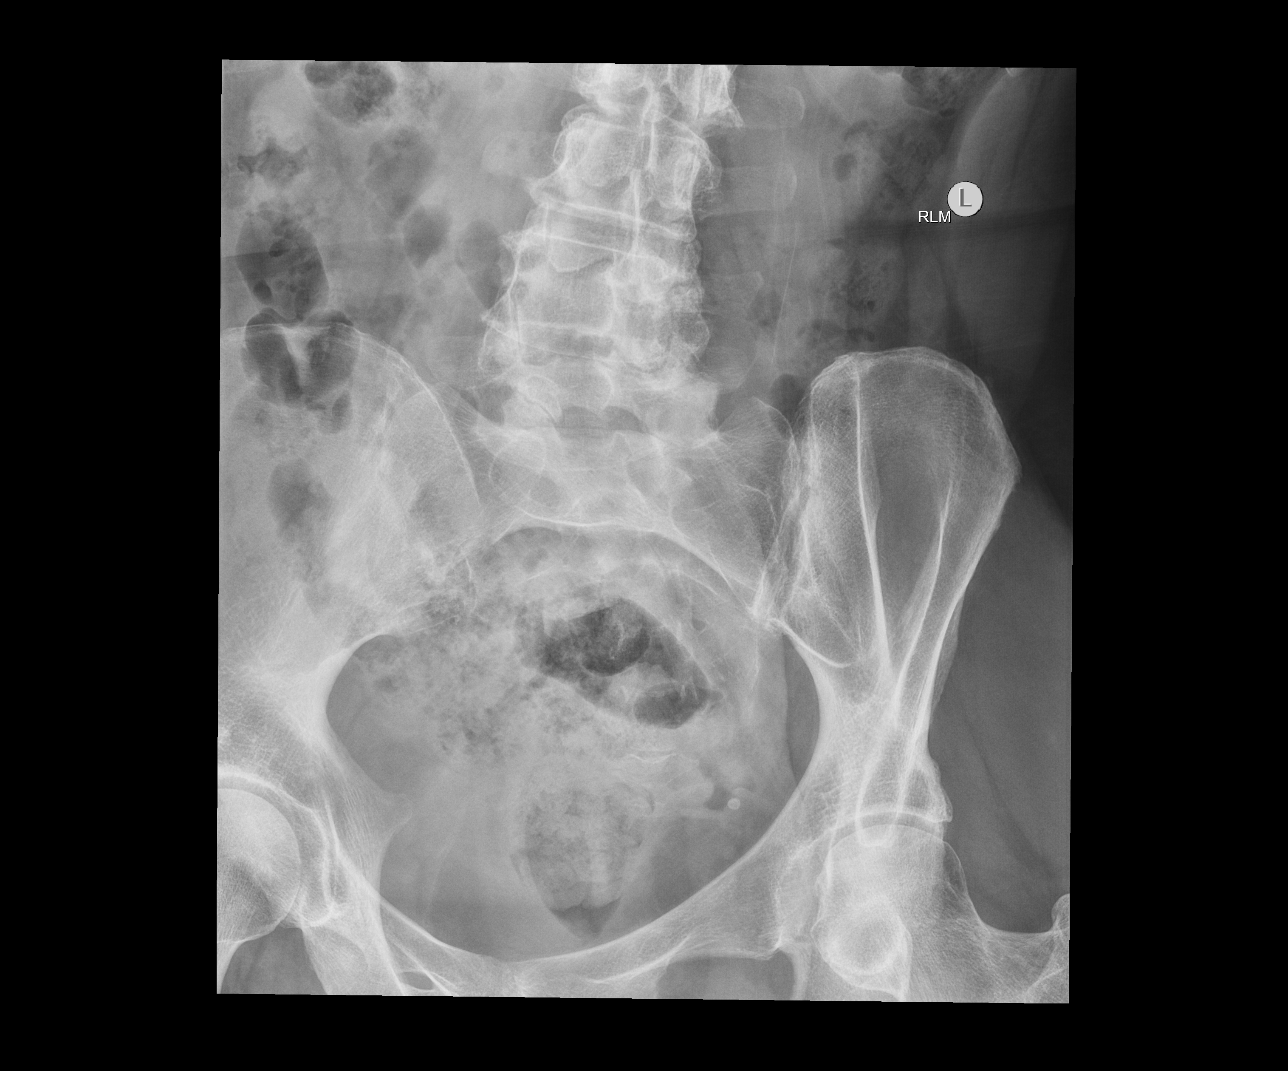

[3 of 3 positions shown; findings below may reference images not displayed]

FINDINGS: Mild to moderate SI joint degenerative changes. No evidence for 
reactive osteitis or ankylosis. No fracture or bone destruction. Mild 
osteopenia.
IMPRESSION: Mild to moderate bilateral SI joint degenerative change, consistent with 
osteoarthritis.

## 2021-07-07 IMAGING — DX HIP 2 VIEWS RIGHT WITH PELVIS
3 series · 3 of 3 positions shown · non-contrast
Comparison: None.

________________________________________________________________________________________________ 
HIP 2 VIEWS RIGHT WITH PELVIS, 07/07/2021 [DATE]: 
CLINICAL INDICATION: Pain..

[AP (1 of 2)]
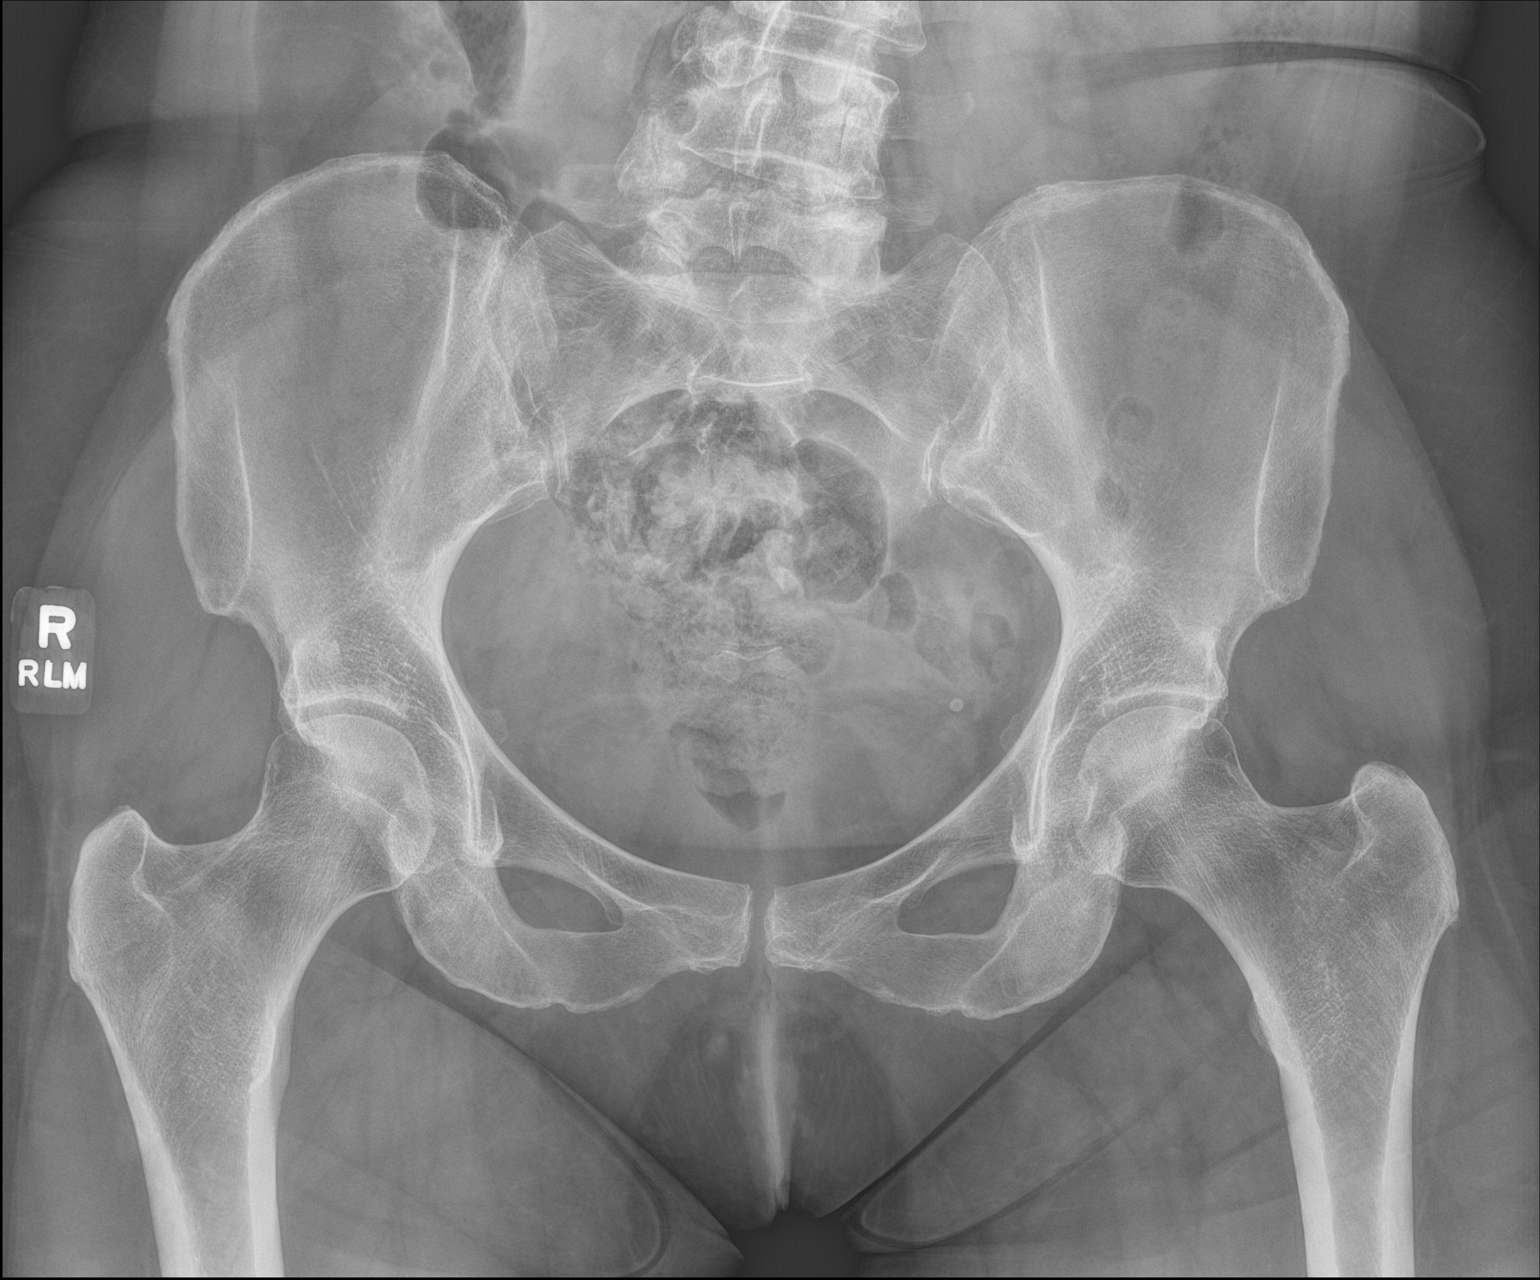

[AP (2 of 2)]
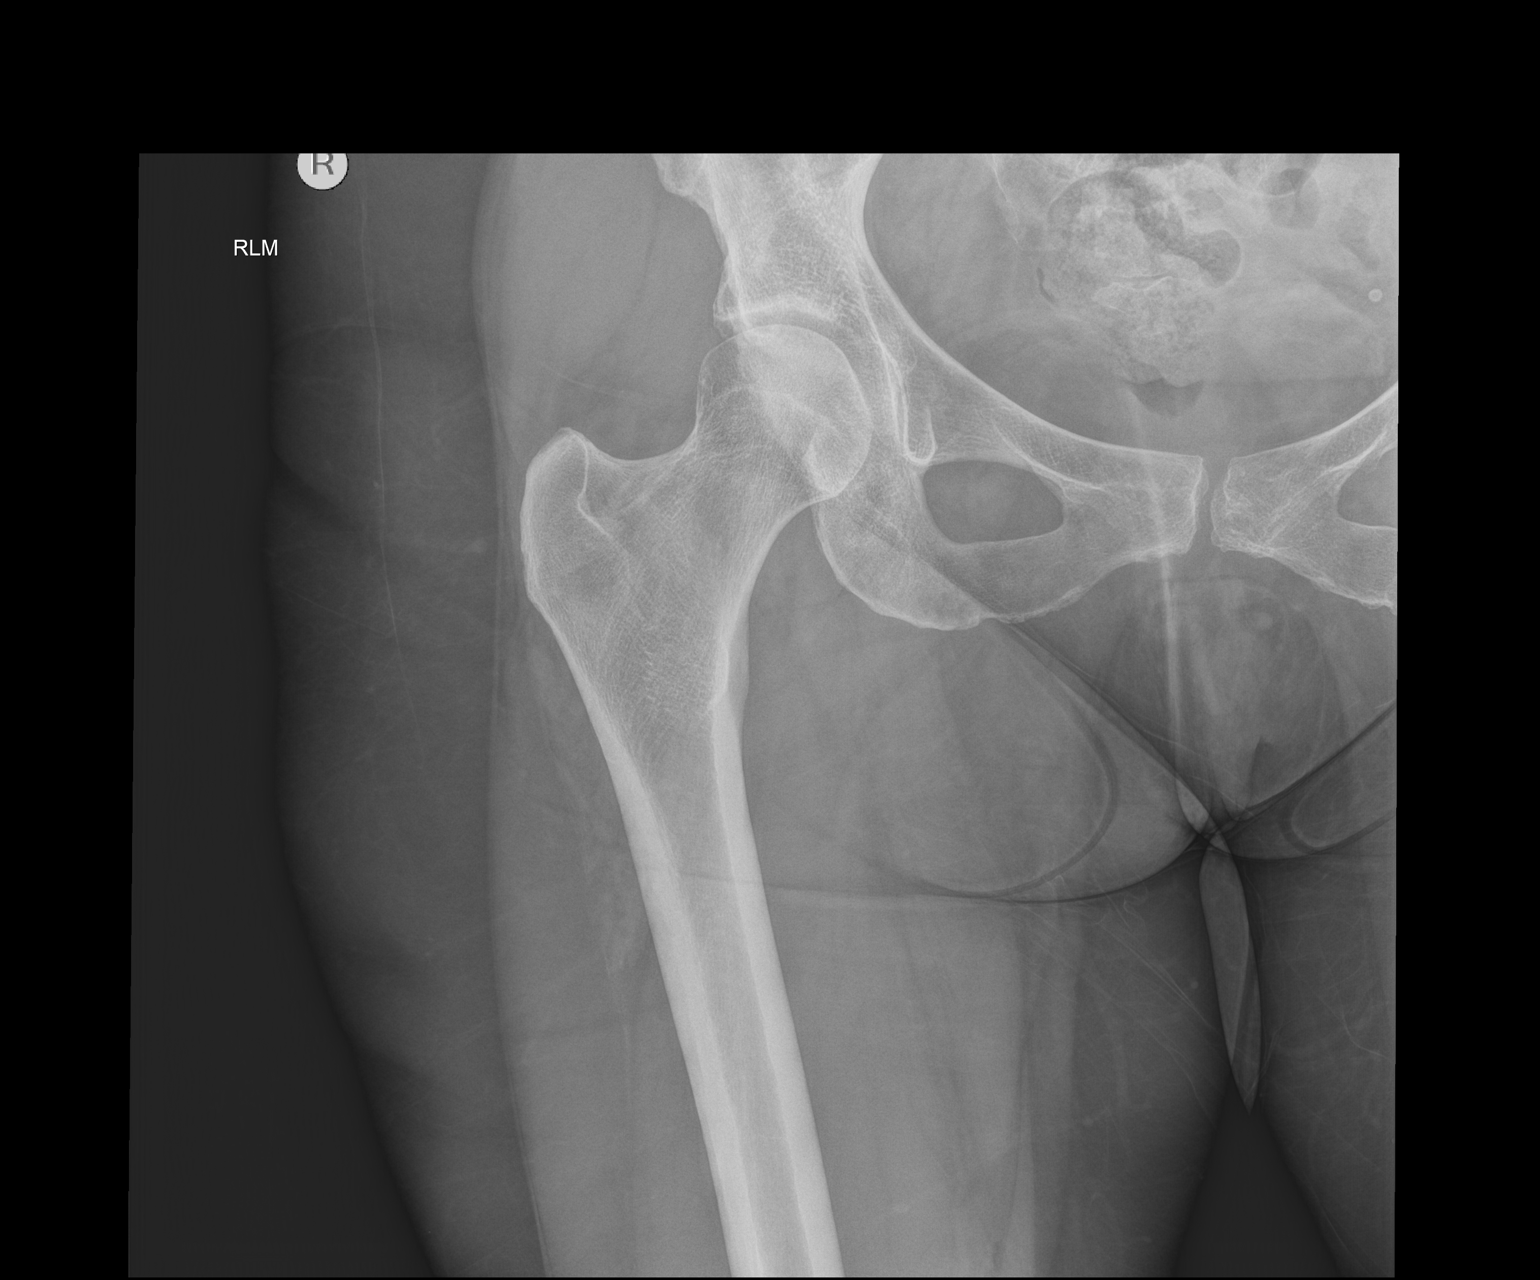

[lateral]
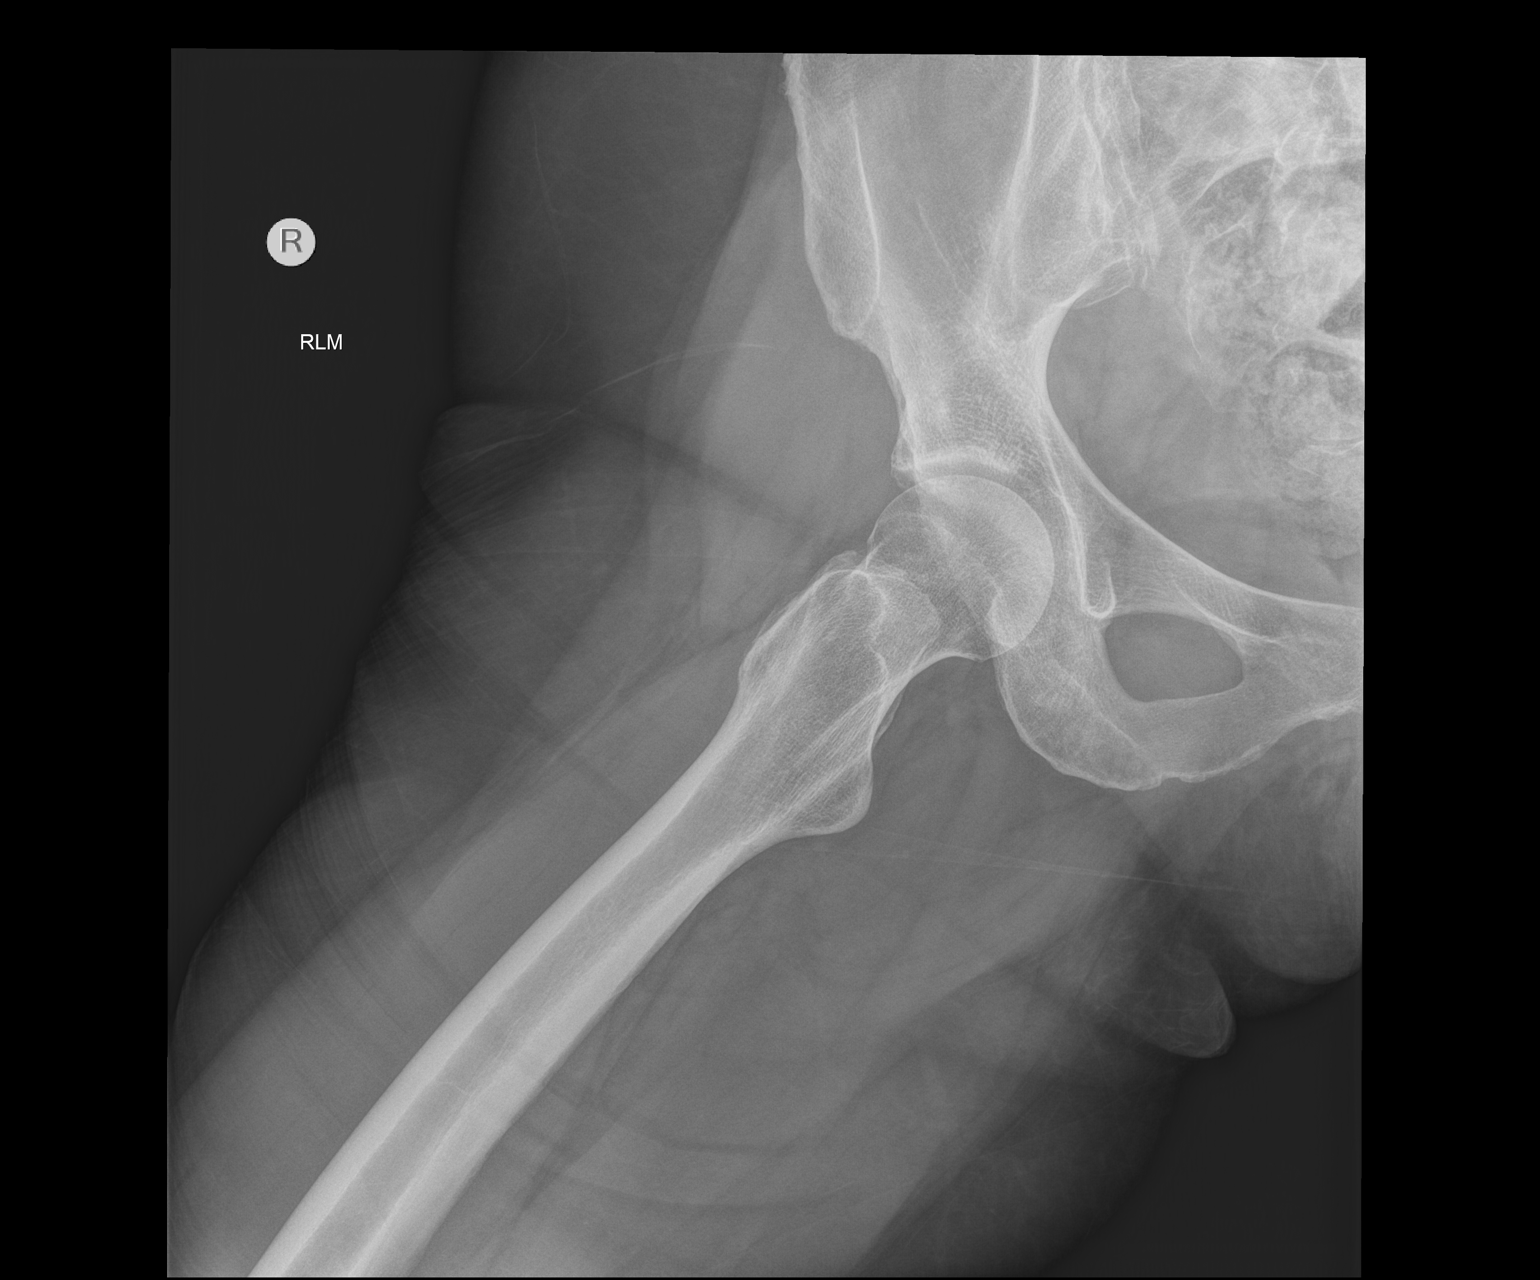

[3 of 3 positions shown; findings below may reference images not displayed]

FINDINGS: No fracture. Normal alignment. Hip joint spaces appear preserved. 
Degenerative changes included lower lumbar spine. Mild degenerative changes SI 
joints. Pelvic phleboliths.
IMPRESSION: No acute osseous abnormality. 
Degenerative changes.

## 2021-07-14 IMAGING — MR MRI LUMBAR SPINE WITHOUT CONTRAST
4 of 8 series · 13 of 48 positions shown · IV contrast (gadolinium)
Comparison: Lumbar radiograph July 07, 2021

________________________________________________________________________________________________ 
MRI LUMBAR SPINE WITHOUT CONTRAST, 07/14/2021 [DATE]: 
CLINICAL INDICATION: Lumbar radiculopathy, scoliosis
TECHNIQUE: Sagittal T1, Sagittal T2, Sagittal STIR, Axial T1 and Axial T2 MR 
images of the lumbar spine were performed without intravenous gadolinium 
enhancement.

[Series 101: survey · axial · 10.0mm · 1.39mm/px · 1 of 10 slices shown]
[im 1/10]
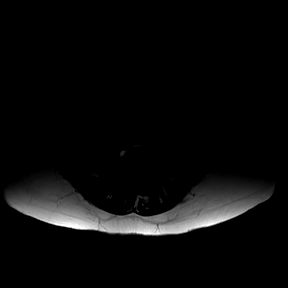

[Series 201: t2w_cor-surv · coronal · 6.0mm · 0.60mm/px · 1 of 8 slices shown]
[im 1/8]
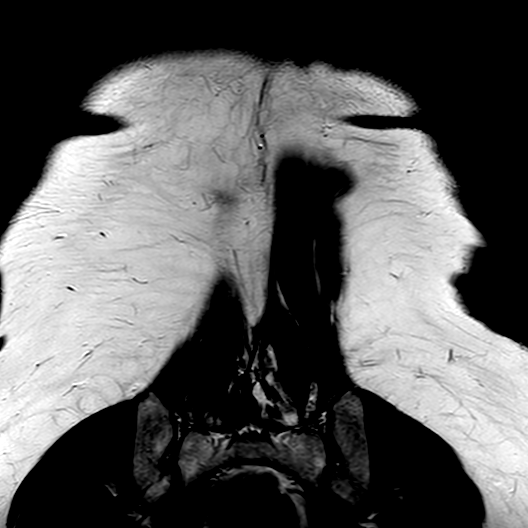

[Series 301: t1_tse_sag · sagittal · 4.0mm · 0.39mm/px · 3 of 17 slices shown]
[im 1/17]
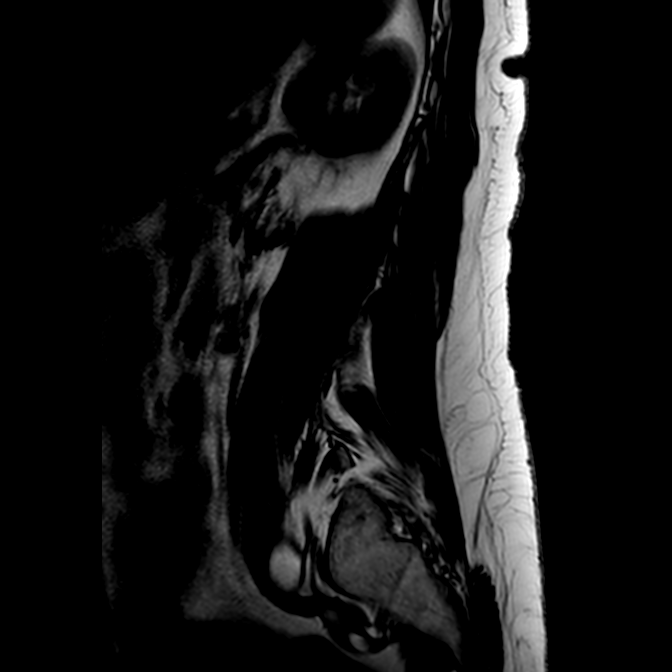
[im 9/17]
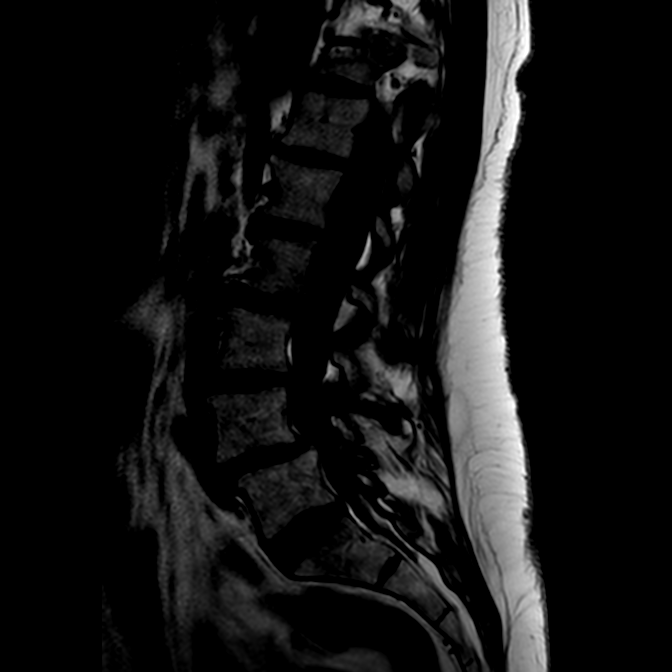
[im 17/17]
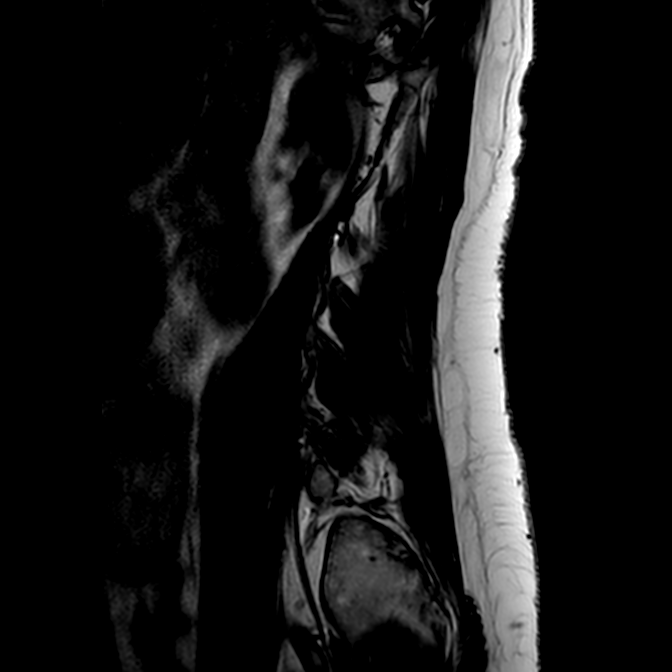

[Series 401: 3d_spine_view_t2w · sagittal · 1.4mm · 0.44mm/px · 8 of 90 slices shown]
[im 1/90]
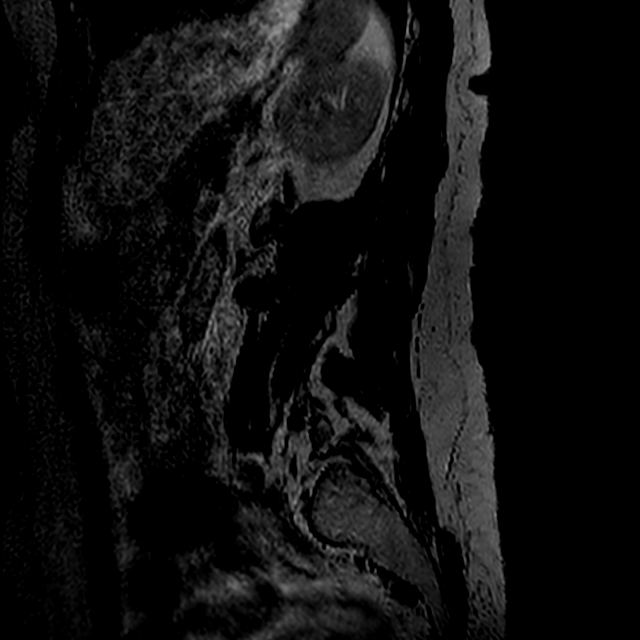
[im 14/90]
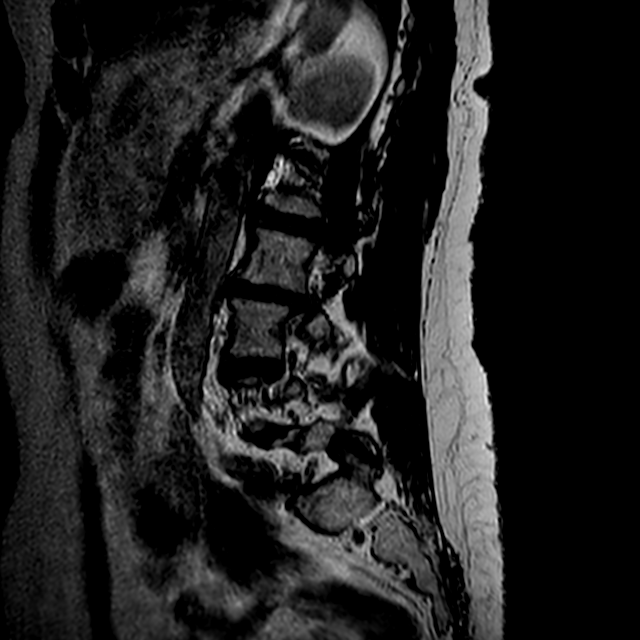
[im 28/90]
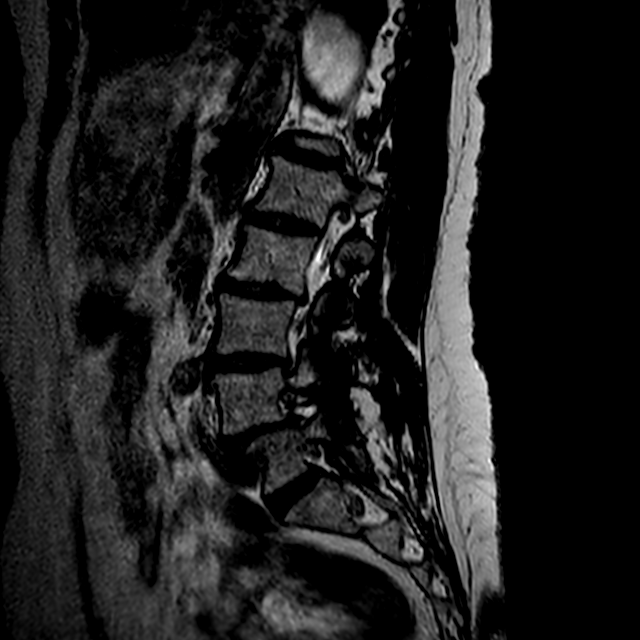
[im 42/90]
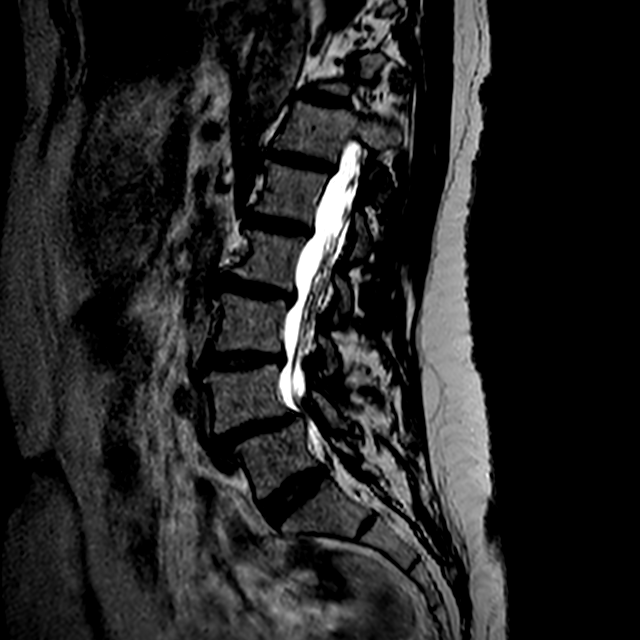
[im 48/90]
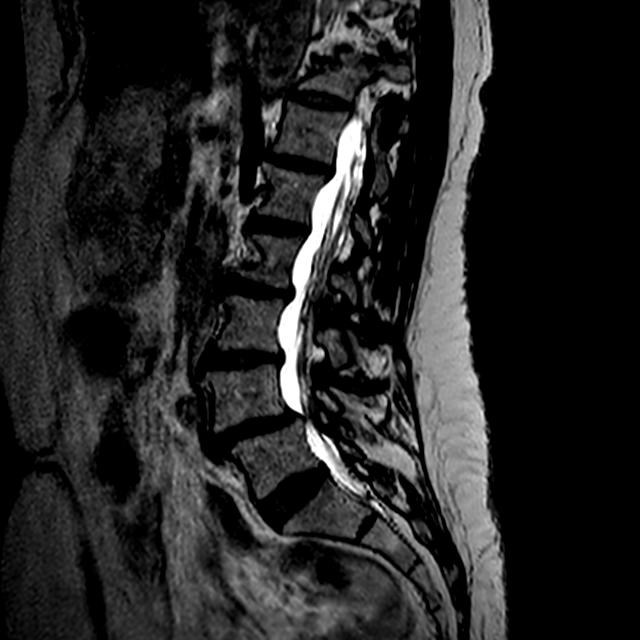
[im 62/90]
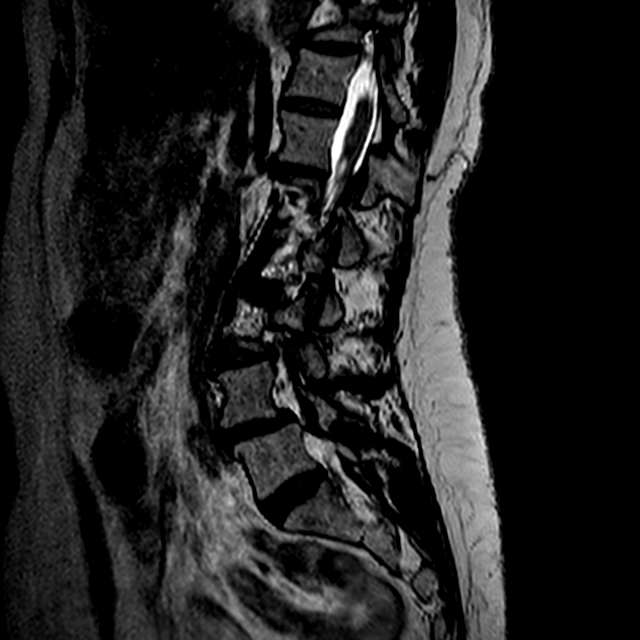
[im 76/90]
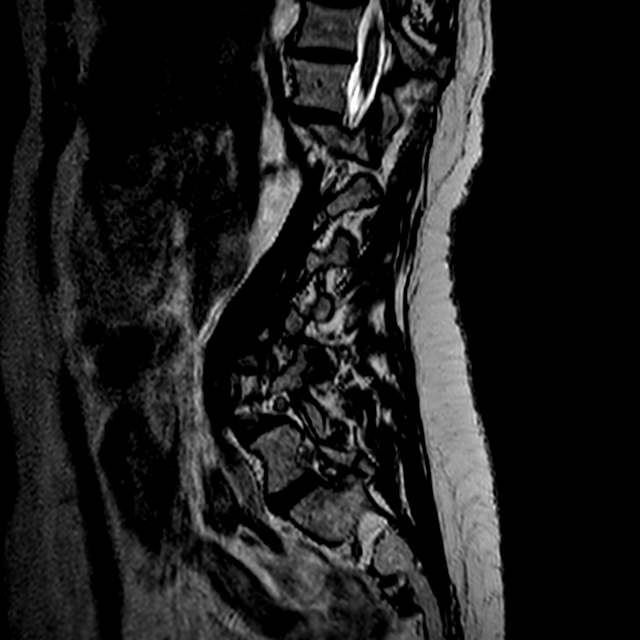
[im 90/90]
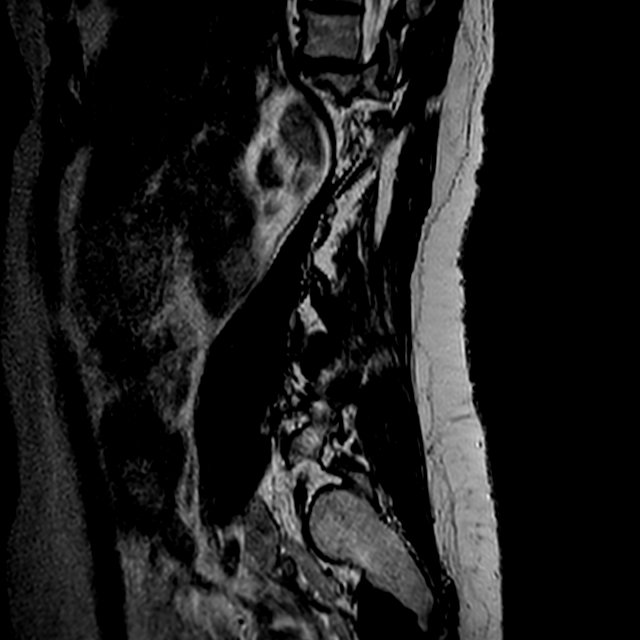

[13 of 48 positions shown; findings below may reference images not displayed]

FINDINGS: Lumbar vertebral heights are intact. There is grade 1 anterolisthesis 
at L4-5. No pars defect. There is moderate-marked disc narrowing along the left 
L4-5 interspace. Other lumbar disc heights preserved. There is moderate upper 
lumbar levoscoliosis. 
Conus terminates opposite T12-L1. There is no evidence for malignancy. There are 
mild Modic type I changes along the posterior L2-3 and L3-4 endplates. 
At L5-S1 the canal is open. Foramina are open. Moderate facet degenerative 
change. 
At L4-5 there is moderate canal stenosis, and stenosis of the L5 lateral 
recesses, appearing worse on the right. There is no significant foraminal 
stenosis. Marked facet degenerative change. 
At L3-4 there is mild disc bulge and moderate to marked facet change. There is 
mild canal stenosis. Mild right foraminal narrowing. 
At L2-3 there is disc bulge contributing to borderline-mild canal stenosis. 
Foramina are open. Moderate facet change. 
At L1-2 the canal and foramina are open. 
At T12-L1 the canal and foramina are open.
IMPRESSION: Multilevel degenerative changes associated with moderate upper lumbar 
levoscoliosis. There is moderate canal stenosis at L4-5, with stenosis of the L5 
lateral recesses appearing worse on the right. There is grade 1 anterolisthesis 
of L4 and L5 measuring 6 mm. No pars defect. 
Less pronounced degenerative changes elsewhere, including mild canal stenosis at 
L3-4. 
Mild Modic type I change along the posterior L2-3 and L3-4 interspaces. 
Hepatic vertical span 17 cm, with Riedels configuration. There are

## 2023-01-18 IMAGING — MG MAMMOGRAPHY SCREENING BILATERAL 3[PERSON_NAME]
8 series · 9 of 24 positions shown · non-contrast
Comparison: 02/20/2021 and exams dating back to 06/24/2017

________________________________________________________________________________________________ 
MAMMOGRAPHY SCREENING BILATERAL 3FERIENHAUS ERXLEBEN, 01/18/2023 [DATE]: 
CLINICAL INDICATION: Encounter for screening mammogram.
TECHNIQUE: Digital bilateral mammograms and 3-D Tomosynthesis were obtained. 
These were interpreted both primarily and with the aid of computer-aided 
detection system.  
BREAST DENSITY: (Level C) The breasts are heterogeneously dense, which may 
obscure small masses.

[L CC]
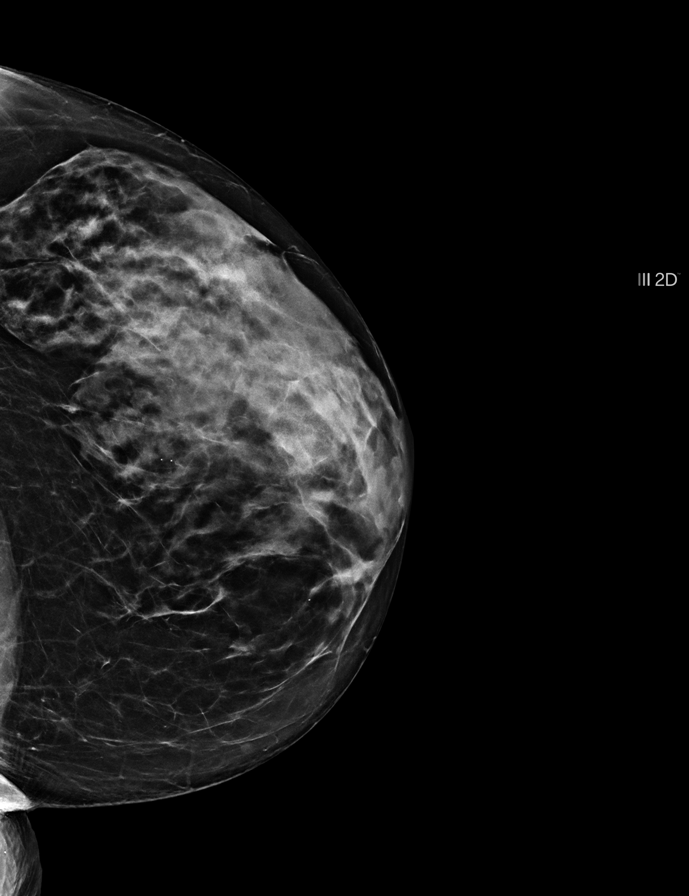

[R CC]
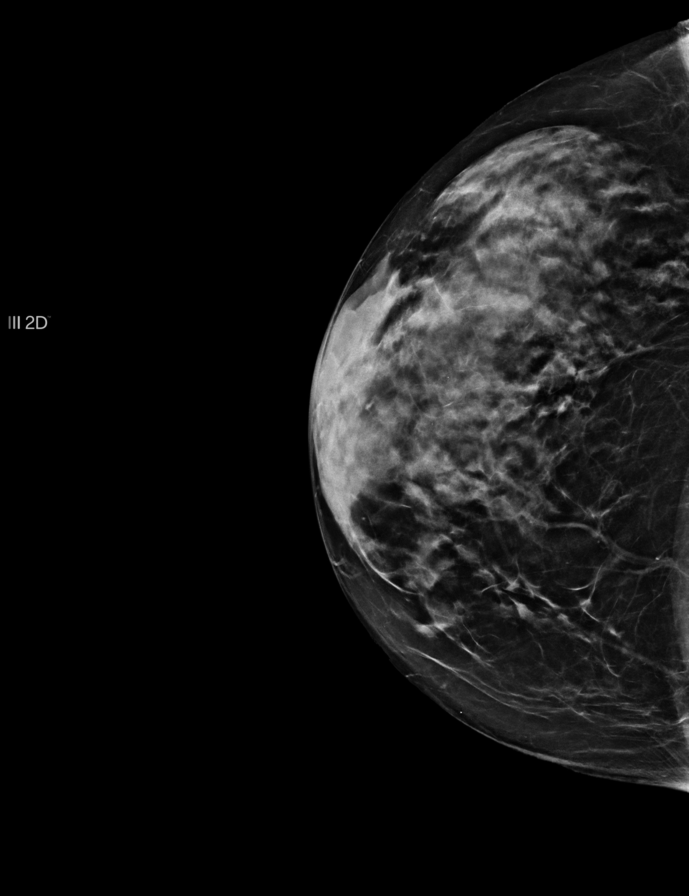

[L MLO]
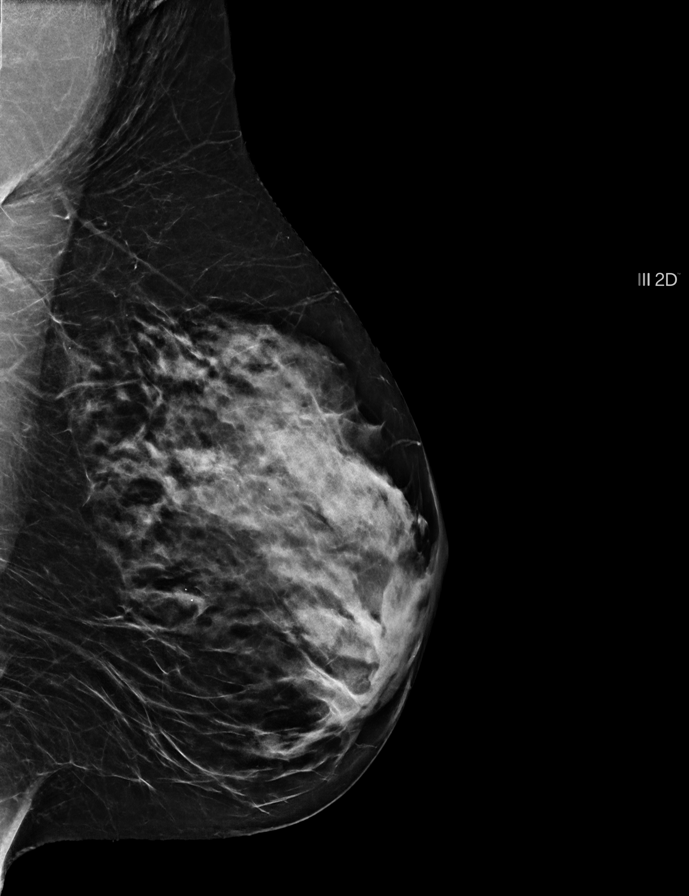

[R MLO]
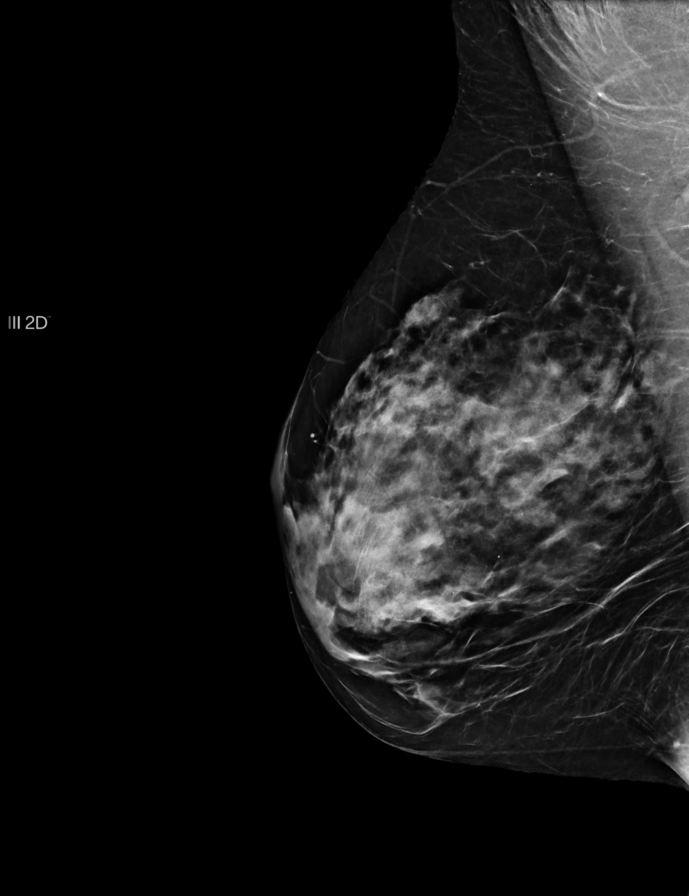

[R MLO tomo · 2 of 21 frames shown]
[frame 7/21]
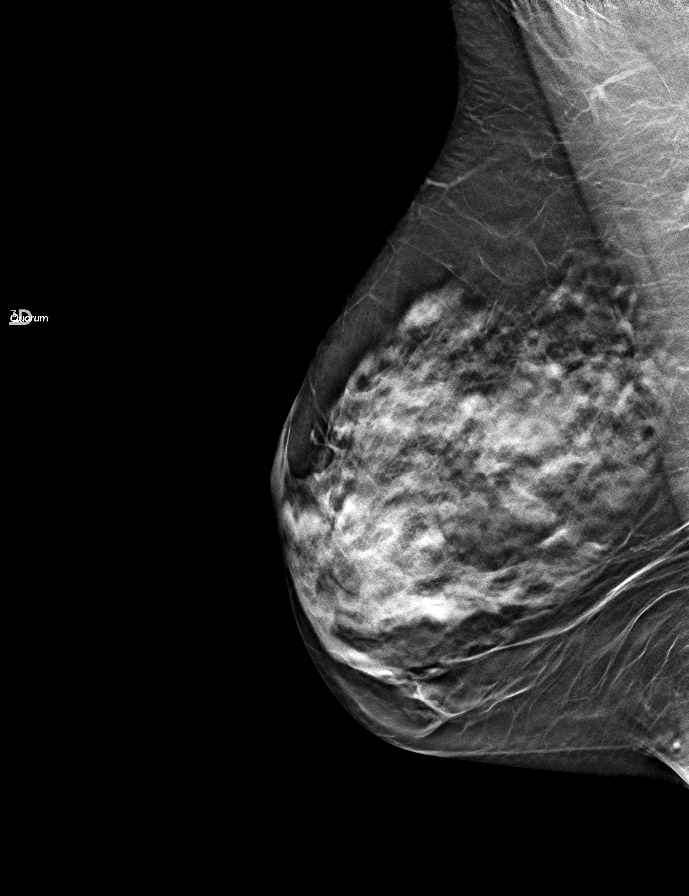
[frame 11/21]
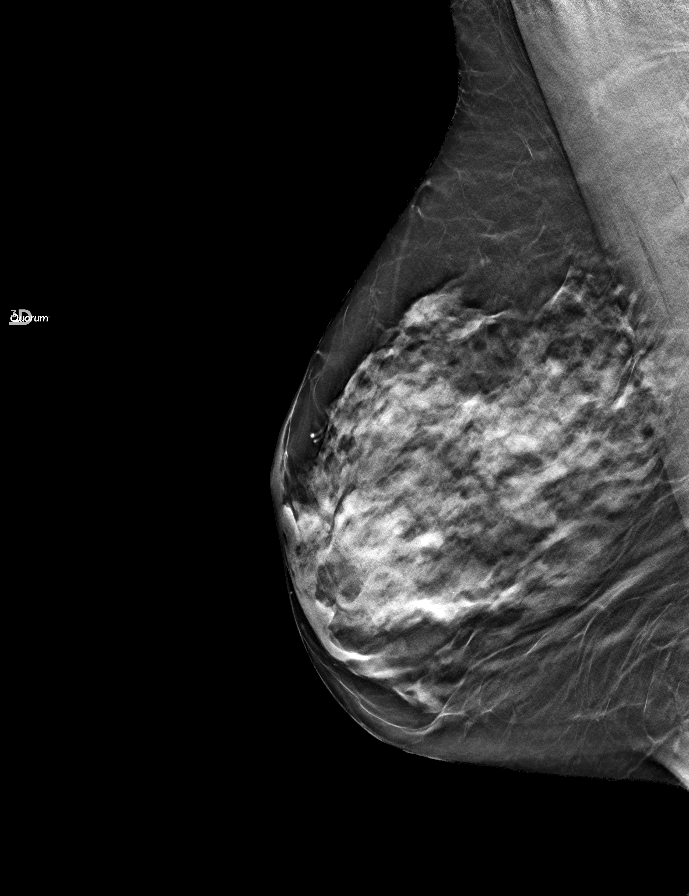

[L MLO tomo · tomo slice 11/21.0]
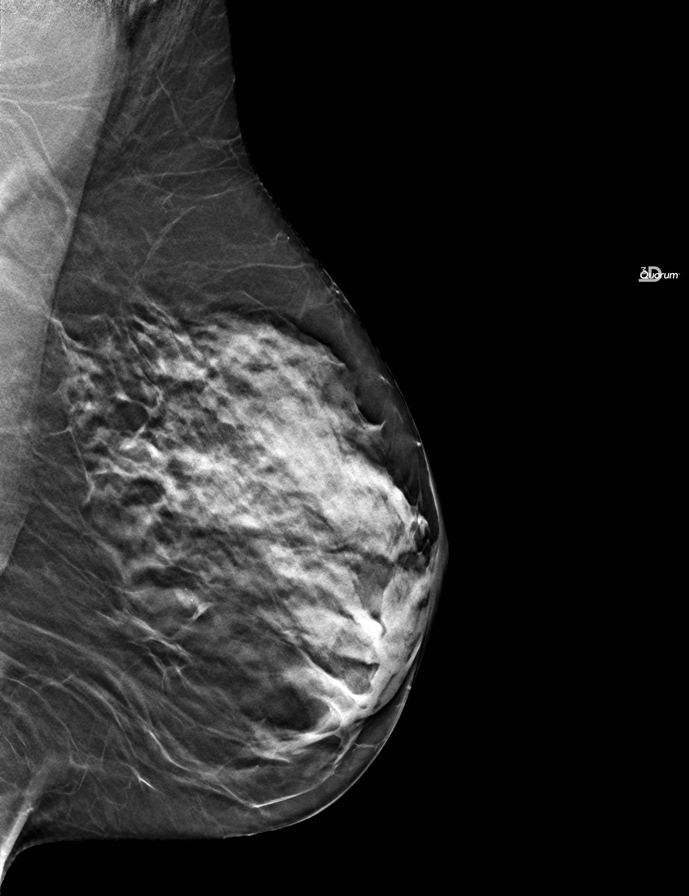

[R CC tomo · tomo slice 11/20.0]
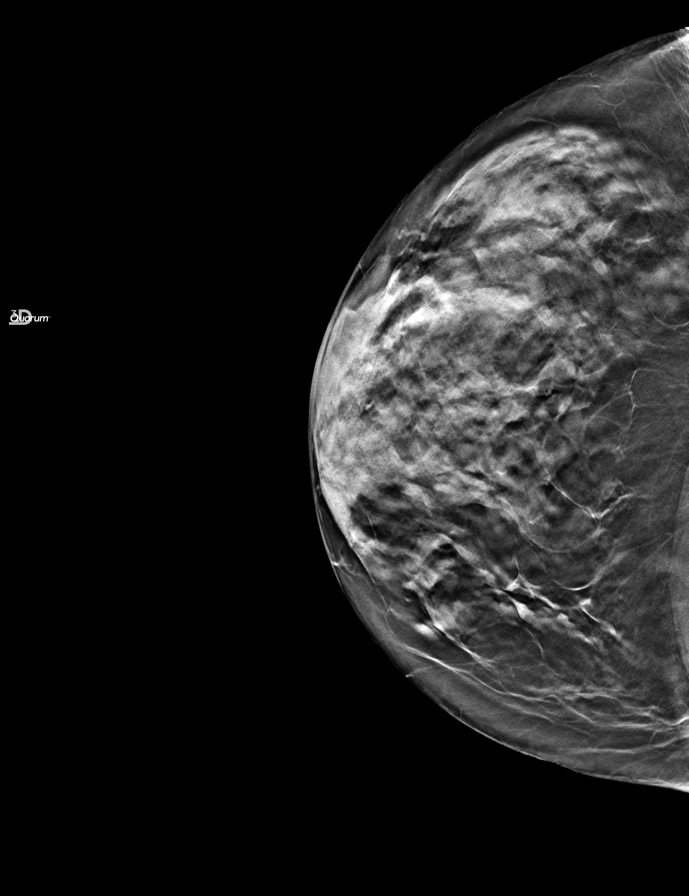

[L CC tomo · tomo slice 11/20.0]
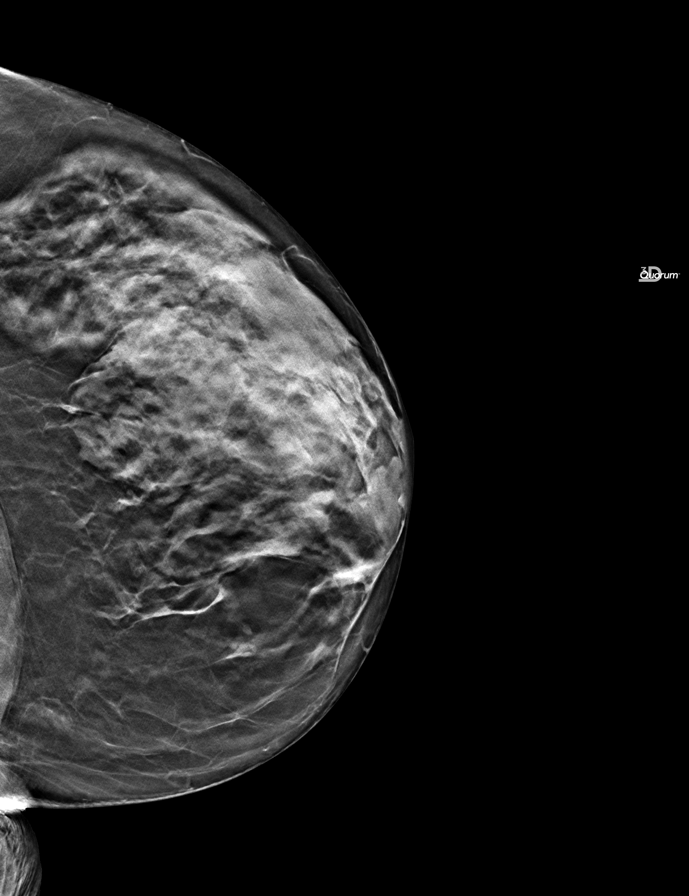

[9 of 24 positions shown; findings below may reference images not displayed]

FINDINGS: No suspicious mass, calcifications, or area of architectural 
distortion in either breast. Overall stable mammographic appearance.
IMPRESSION: No mammographic findings suggestive for malignancy. 
(BI-RADS 2) Benign findings. Routine mammographic follow-up is recommended.

## 2023-11-07 ENCOUNTER — Other Ambulatory Visit (INDEPENDENT_AMBULATORY_CARE_PROVIDER_SITE_OTHER): Payer: Self-pay | Admitting: Family Medicine
